# Patient Record
Sex: Female | Born: 2000
Health system: Southern US, Community
[De-identification: ages and names within clinical notes are randomized; demographics above are authoritative.]

## PROBLEM LIST (undated history)

## (undated) DIAGNOSIS — N83209 Unspecified ovarian cyst, unspecified side: Secondary | ICD-10-CM

## (undated) DIAGNOSIS — R519 Headache, unspecified: Secondary | ICD-10-CM

## (undated) DIAGNOSIS — D649 Anemia, unspecified: Secondary | ICD-10-CM

## (undated) DIAGNOSIS — N390 Urinary tract infection, site not specified: Secondary | ICD-10-CM

## (undated) HISTORY — DX: Anemia, unspecified: D64.9

## (undated) HISTORY — PX: NO PAST SURGERIES: SHX2092

---
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
## 2018-03-15 ENCOUNTER — Ambulatory Visit (HOSPITAL_COMMUNITY)
Admission: EM | Admit: 2018-03-15 | Discharge: 2018-03-15 | Disposition: A | Discharge status: Home / Self Care | Payer: Self-pay | Attending: Family Medicine | Admitting: Family Medicine

## 2018-03-15 ENCOUNTER — Encounter (HOSPITAL_COMMUNITY): Payer: Self-pay

## 2018-03-15 DIAGNOSIS — J0100 Acute maxillary sinusitis, unspecified: Secondary | ICD-10-CM

## 2018-03-15 MED ORDER — FLUTICASONE PROPIONATE 50 MCG/ACT NA SUSP: 2.0000 | Freq: Every day | NASAL | 0 refills | Status: DC

## 2018-03-15 MED ORDER — AMOXICILLIN-POT CLAVULANATE 875-125 MG PO TABS: 1.0000 | ORAL_TABLET | Freq: Two times a day (BID) | ORAL | 0 refills | Status: AC

## 2018-03-15 NOTE — ED Provider Notes (Signed)
[facility]   [id number] 03/15/18 Arrival Time: 1847   CC: sinus pain and pressure  SUBJECTIVE: History from: patient.  Rachael Sanchez is a 17 y.o. female who presents with abrupt onset of worsening sinus pressure, nasal congestion, and cough that began 5 days ago.  Denies positive sick exposure or precipitating event.  Has tried ibuprofen and theraflu without relief.  Symptoms are made worse with "working."  Denies previous symptoms in the past.  Complains of chills, and intermittent post-tussive emesis. Also complains of intermittent pink streaks in vomit.  Denies fever, fatigue, SOB, wheezing, chest pain, nausea, blood, bilious, or coffee grounds emesis, changes in bowel or bladder habits.    ROS: As per HPI.  History reviewed. No pertinent past medical history. History reviewed. No pertinent surgical history. No Known Allergies No current facility-administered medications on file prior to encounter.    Current Outpatient Medications on File Prior to Encounter  Medication Sig Dispense Refill  . acetaminophen (TYLENOL) 500 MG tablet Take 500 mg by mouth every 6 (six) hours as needed for mild pain, fever or headache.     Social History   Socioeconomic History  . Marital status: Single    Spouse name: Not on file  . Number of children: Not on file  . Years of education: Not on file  . Highest education level: Not on file  Occupational History  . Not on file  Social Needs  . Financial resource strain: Not on file  . Food insecurity:    Worry: Not on file    Inability: Not on file  . Transportation needs:    Medical: Not on file    Non-medical: Not on file  Tobacco Use  . Smoking status: Current Some Day Smoker  . Smokeless tobacco: Never Used  Substance and Sexual Activity  . Alcohol use: No    Frequency: Never  . Drug use: Not on file  . Sexual activity: Not on file  Lifestyle  . Physical activity:    Days per week: Not on file    Minutes per  session: Not on file  . Stress: Not on file  Relationships  . Social connections:    Talks on phone: Not on file    Gets together: Not on file    Attends religious service: Not on file    Active member of club or organization: Not on file    Attends meetings of clubs or organizations: Not on file    Relationship status: Not on file  . Intimate partner violence:    Fear of current or ex partner: Not on file    Emotionally abused: Not on file    Physically abused: Not on file    Forced sexual activity: Not on file  Other Topics Concern  . Not on file  Social History Narrative  . Not on file   History reviewed. No pertinent family history.  OBJECTIVE:  Vitals:   03/15/18 1942  BP: 119/70  Pulse: 100  Resp: 19  Temp: 98.4 F (36.9 C)  TempSrc: Temporal  SpO2: 100%     General appearance: alert, appears fatigued; nontoxic appearance HEENT: Ears: EACs clear, TMs pearly gray with visible cone of light, without erythema; Eyes: PERRL, EOMI grossly; maxillary sinus tenderness; Nose: clear rhinorrhea; Throat: oropharynx clear, tonsils not enlarged or erythematous, uvula midline Neck: supple without LAD Lungs: CTAB Heart: regular rate and rhythm.  Radial pulses 2+ symmetrical bilaterally Abdomen: Soft nondistended, normal active bowel sounds; nontender to palpation;  no guarding Skin: warm and dry Psychological: alert and cooperative; normal mood and affect  ASSESSMENT & PLAN:  1. Acute non-recurrent maxillary sinusitis     Meds ordered this encounter  Medications  . amoxicillin-clavulanate (AUGMENTIN) 875-125 MG tablet    Sig: Take 1 tablet by mouth every 12 (twelve) hours for 10 days.    Dispense:  20 tablet    Refill:  0    Order Specific Question:   Supervising Provider    Answer:   Isa Rankin [phone]  . fluticasone (FLONASE) 50 MCG/ACT nasal spray    Sig: Place 2 sprays into both nostrils daily.    Dispense:  16 g    Refill:  0    Order Specific  Question:   Supervising Provider    Answer:   Isa Rankin [[id number]]   Get plenty of rest and push fluids Augmentin prescribed.   Take as directed and to completion Flonase prescribed.  Use as directed for symptomatic relief Use OTC medication as needed for symptomatic relief Follow up with PCP or with [facility] if symptoms persist Return or go to ER if you have any new or worsening symptoms    Reviewed expectations re: course of current medical issues. Questions answered. Outlined signs and symptoms indicating need for more acute intervention. Patient verbalized understanding. After Visit Summary given.         Rennis Harding, PA-C 03/15/18 2042

## 2018-03-15 NOTE — Discharge Instructions (Signed)
Get plenty of rest and push fluids Augmentin prescribed.   Take as directed and to completion Flonase prescribed.  Use as directed for symptomatic relief Use OTC medication as needed for symptomatic relief Follow up with PCP or with [facility] if symptoms persist Return or go to ER if you have any new or worsening symptoms

## 2018-03-15 NOTE — ED Triage Notes (Signed)
Pt presents with sinus issues; congestion, vomiting blood, coughing, chills, ear pain and headache.

## 2018-04-15 ENCOUNTER — Ambulatory Visit: Payer: Self-pay

## 2018-04-19 ENCOUNTER — Telehealth: Payer: Self-pay | Admitting: Licensed Clinical Social Worker

## 2018-04-19 NOTE — Telephone Encounter (Signed)
Patient called. No answer. Voicemail left to remind to reschedule appointment.

## 2018-07-04 ENCOUNTER — Encounter: Attending: Family Medicine | Primary: Student in an Organized Health Care Education/Training Program

## 2018-07-04 ENCOUNTER — Ambulatory Visit
Admit: 2018-07-04 | Discharge: 2018-07-04 | Payer: PRIVATE HEALTH INSURANCE | Attending: Family Medicine | Primary: Student in an Organized Health Care Education/Training Program

## 2018-07-04 ENCOUNTER — Ambulatory Visit: Attending: Family Medicine | Primary: Student in an Organized Health Care Education/Training Program

## 2018-07-04 DIAGNOSIS — R519 Headache, unspecified: Secondary | ICD-10-CM

## 2018-07-04 LAB — AMB POC RAPID INFLUENZA TEST
QuickVue Influenza test: NEGATIVE
QuickVue Influenza test: NEGATIVE

## 2018-07-04 LAB — AMB POC RAPID STREP A
Group A Strep Ag: NEGATIVE
Group A Strep Antigen, POC: NEGATIVE

## 2018-07-04 MED ORDER — ONDANSETRON 8 MG TAB, RAPID DISSOLVE
8 mg | ORAL_TABLET | Freq: Once | ORAL | 0 refills | Status: AC
Start: 2018-07-04 — End: 2018-07-04

## 2018-07-04 MED ORDER — KETOROLAC TROMETHAMINE 60 MG/2 ML IM
60 mg/2 mL | Freq: Once | INTRAMUSCULAR | 0 refills | Status: AC
Start: 2018-07-04 — End: 2018-07-04

## 2018-07-04 MED ORDER — PROMETHAZINE 25 MG TAB
25 mg | ORAL_TABLET | Freq: Four times a day (QID) | ORAL | 1 refills | Status: AC | PRN
Start: 2018-07-04 — End: ?

## 2018-07-04 NOTE — Progress Notes (Signed)
Patient: Melanie MinaDakariyah Barnett MRN: [id number]  SSN: [id number]    Date of Birth: 08-06-2000  Age: 17 y.o.  Sex: female      Chief Complaint   Patient presents with   ??? Vomiting   ??? Diarrhea   ??? Cold Symptoms     congestion, headache     Melanie Barnett is a 17 y.o. female new to me who presents with multiple complaints. She cites persistent headache for past 2 days. There was nausea, vomiting and diarrhea at onset of illness 4 days ago. She complains of congestion, dry cough and bilateral sinus pain.  she has not had  swollen glands, myalgias and fever. Symptoms are moderate. Patient is drinking plenty of fluids. There is not a hx of asthma. There is not a hx of allergic rhinitis. There is not a hx of tobacco use. Patient sans health insurance. She has started working in a [employer] this past week and asks for work note.    Medications:     Current Outpatient Medications   Medication Sig   ??? promethazine (PHENERGAN) 25 mg tablet Take 1 Tab by mouth every six (6) hours as needed for Nausea.     No current facility-administered medications for this visit.        Problem List:   There are no active problems to display for this patient.      Medical History:     Past Medical History:   Diagnosis Date   ??? Anemia        Allergies:   Not on File    Surgical History:   History reviewed. No pertinent surgical history.    Social History:     Social History     Socioeconomic History   ??? Marital status: SINGLE     Spouse name: Not on file   ??? Number of children: Not on file   ??? Years of education: Not on file   ??? Highest education level: Not on file   Tobacco Use   ??? Smoking status: Never Smoker   ??? Smokeless tobacco: Never Used   Substance and Sexual Activity   ??? Alcohol use: Yes   ??? Drug use: Yes     Types: Marijuana   ??? Sexual activity: Yes     Partners: Male     Birth control/protection: Injection       Review of Symptoms:  Constitutional: c/o malaise, denies fever or chills  Skin: Negative for rash or  lesion  Head: Negative for facial swelling or tenderness  Eyes: Negative for redness or discharge  Ears: Negative for otalgia or decreased hearing  Nose: c/o nasal congestion, denies sinus pressure  Neck: c/o sore throat, denies lymphadenopathy   Cardiovascular: Negative for chest pain or palpitations  Respiratory: c/o non-productive cough, denies wheezing or SOB  Gastrointestinal: Negative for nausea or abdominal pain  Neurologic: Negative for headache or dizziness      Visit Vitals  BP 118/75 (BP 1 Location: Right arm, BP Patient Position: Sitting)   Pulse 91   Temp 98.5 ??F (36.9 ??C) (Oral)   Resp 12   Ht 5\' 6"  (1.676 m)   Wt 148 lb (67.1 kg)   SpO2 100%   BMI 23.89 kg/m??       Physical Examination:  General: Well developed, well nourished, in no acute distress  Skin: Warm and dry sans rash or lesion  Head: Normocephalic, atraumatic  Eyes: Sclera clear, EOMI  Oropharynx: posterior erythema, no exudate  Neck: Normal range of motion,   Cardiovascular: normal S1, S2, regular rate and rhythm  Respiratory: Clear to auscultation bilaterally with symmetrical, unlabored effort  Abdomen: Soft, Normal BS, non-tender  Extremities: Full range of motion, normal gait  Neurologic: normal strength and sensation, no focal deficits  Psychiatric: Active, alert and oriented      Diagnoses and all orders for this visit:    1. Acute nonintractable headache, unspecified headache type  -     ketorolac tromethamine (TORADOL) 60 mg/2 mL soln; 2 mL by IntraMUSCular route once for 1 dose.  -     KETOROLAC TROMETHAMINE INJ  -     PR THER/PROPH/DIAG INJECTION, SUBCUT/IM    2. Rhinosinusitis  -     AMB POC RAPID INFLUENZA TEST  -     AMB POC RAPID STREP A    3. Gastroenteritis  -     promethazine (PHENERGAN) 25 mg tablet; Take 1 Tab by mouth every six (6) hours as needed for Nausea.  -     ondansetron (ZOFRAN ODT) 8 mg disintegrating tablet; Take 1 Tab by mouth once for 1 dose.        Symptomatic therapy suggested: rest, increase fluids and  call prn if symptoms persist or worsen.  I have discussed the diagnosis with the patient and the intended plan as seen in the above orders.  The patient expresses understanding and agreement with our plan of care.   All of the patient's questions were answered to apparent satisfaction.   The patient has received an after-visit summary.   The patient knows to call our office if there are any questions or concerns regarding diagnosis and treatment plans.   I have discussed medication side effects and warnings with the patient as well.        Follow-up and Dispositions    ?? Return in about 4 days (around 07/08/2018).

## 2018-07-04 NOTE — Progress Notes (Signed)
 1. Have you been to the ER, urgent care clinic since your last visit?  Hospitalized since your last visit? Yes, [place] , headache/sinus infection    2. Have you seen or consulted any other health care providers outside of the [facility] since your last visit?  Include any pap smears or colon screening. No  Reviewed record in preparation for visit and have necessary documentation  Pt did not bring medication to office visit for review    Goals that were addressed and/or need to be completed during or after this appointment include     Health Maintenance Due   Topic Date Due   . DTaP/Tdap/Td series (1 - Tdap) 06/01/2006   . HPV Age 9Y-26Y (1 - Female 2-dose series) 06/01/2010   . Influenza Age 68 to Adult  02/17/2018

## 2018-07-04 NOTE — Progress Notes (Signed)
Patient: Melanie Barnett MRN: [id number]  SSN: [id number]    Date of Birth: 2001/06/18  Age: 17 y.o.  Sex: female      Chief Complaint   Patient presents with   ??? Vomiting   ??? Diarrhea   ??? Cold Symptoms     congestion, headache     Melanie Barnett is a 17 y.o. female new to me who presents with multiple complaints. She cites persistent headache for past 2 days. There was nausea, vomiting and diarrhea at onset of illness 4 days ago. She complains of congestion, dry cough and bilateral sinus pain.  she has not had  swollen glands, myalgias and fever. Symptoms are moderate. Patient is drinking plenty of fluids. There is not a hx of asthma. There is not a hx of allergic rhinitis. There is not a hx of tobacco use. Patient sans health insurance. She has started working in a [employer] this past week and asks for work note.    Medications:     Current Outpatient Medications   Medication Sig   ??? promethazine (PHENERGAN) 25 mg tablet Take 1 Tab by mouth every six (6) hours as needed for Nausea.     No current facility-administered medications for this visit.        Problem List:   There are no active problems to display for this patient.      Medical History:     Past Medical History:   Diagnosis Date   ??? Anemia        Allergies:   Not on File    Surgical History:   History reviewed. No pertinent surgical history.    Social History:     Social History     Socioeconomic History   ??? Marital status: SINGLE     Spouse name: Not on file   ??? Number of children: Not on file   ??? Years of education: Not on file   ??? Highest education level: Not on file   Tobacco Use   ??? Smoking status: Never Smoker   ??? Smokeless tobacco: Never Used   Substance and Sexual Activity   ??? Alcohol use: Yes   ??? Drug use: Yes     Types: Marijuana   ??? Sexual activity: Yes     Partners: Male     Birth control/protection: Injection       Review of Symptoms:  Constitutional: c/o malaise, denies fever or chills  Skin: Negative for rash or lesion   Head: Negative for facial swelling or tenderness  Eyes: Negative for redness or discharge  Ears: Negative for otalgia or decreased hearing  Nose: c/o nasal congestion, denies sinus pressure  Neck: c/o sore throat, denies lymphadenopathy   Cardiovascular: Negative for chest pain or palpitations  Respiratory: c/o non-productive cough, denies wheezing or SOB  Gastrointestinal: Negative for nausea or abdominal pain  Neurologic: Negative for headache or dizziness      Visit Vitals  BP 118/75 (BP 1 Location: Right arm, BP Patient Position: Sitting)   Pulse 91   Temp 98.5 ??F (36.9 ??C) (Oral)   Resp 12   Ht 5\' 6"  (1.676 m)   Wt 148 lb (67.1 kg)   SpO2 100%   BMI 23.89 kg/m??       Physical Examination:  General: Well developed, well nourished, in no acute distress  Skin: Warm and dry sans rash or lesion  Head: Normocephalic, atraumatic  Eyes: Sclera clear, EOMI  Oropharynx: posterior erythema, no exudate  Neck: Normal range of motion,   Cardiovascular: normal S1, S2, regular rate and rhythm  Respiratory: Clear to auscultation bilaterally with symmetrical, unlabored effort  Abdomen: Soft, Normal BS, non-tender  Extremities: Full range of motion, normal gait  Neurologic: normal strength and sensation, no focal deficits  Psychiatric: Active, alert and oriented      Diagnoses and all orders for this visit:    1. Acute nonintractable headache, unspecified headache type  -     ketorolac tromethamine (TORADOL) 60 mg/2 mL soln; 2 mL by IntraMUSCular route once for 1 dose.  -     KETOROLAC TROMETHAMINE INJ  -     PR THER/PROPH/DIAG INJECTION, SUBCUT/IM    2. Rhinosinusitis  -     AMB POC RAPID INFLUENZA TEST  -     AMB POC RAPID STREP A    3. Gastroenteritis  -     promethazine (PHENERGAN) 25 mg tablet; Take 1 Tab by mouth every six (6) hours as needed for Nausea.  -     ondansetron (ZOFRAN ODT) 8 mg disintegrating tablet; Take 1 Tab by mouth once for 1 dose.         Symptomatic therapy suggested: rest, increase fluids and call prn if symptoms persist or worsen.  I have discussed the diagnosis with the patient and the intended plan as seen in the above orders.  The patient expresses understanding and agreement with our plan of care.   All of the patient's questions were answered to apparent satisfaction.   The patient has received an after-visit summary.   The patient knows to call our office if there are any questions or concerns regarding diagnosis and treatment plans.   I have discussed medication side effects and warnings with the patient as well.        Follow-up and Dispositions    ?? Return in about 4 days (around 07/08/2018).

## 2018-07-04 NOTE — Progress Notes (Signed)
1. Have you been to the ER, urgent care clinic since your last visit?  Hospitalized since your last visit? Yes, [place], headache/sinus infection    2. Have you seen or consulted any other health care providers outside of the [facility] since your last visit?  Include any pap smears or colon screening. No  Reviewed record in preparation for visit and have necessary documentation  Pt did not bring medication to office visit for review    Goals that were addressed and/or need to be completed during or after this appointment include     Health Maintenance Due   Topic Date Due   ??? DTaP/Tdap/Td series (1 - Tdap) 06/01/2006   ??? HPV Age 9Y-26Y (1 - Female 2-dose series) 06/01/2010   ??? Influenza Age 9 to Adult  02/17/2018

## 2018-07-04 NOTE — Patient Instructions (Signed)
Headache: Care Instructions  Your Care Instructions    Headaches have many possible causes. Most headaches aren't a sign of a more serious problem, and they will get better on their own. Home treatment may help you feel better faster.  The doctor has checked you carefully, but problems can develop later. If you notice any problems or new symptoms, get medical treatment right away.  Follow-up care is a key part of your treatment and safety. Be sure to make and go to all appointments, and call your doctor if you are having problems. It's also a good idea to know your test results and keep a list of the medicines you take.  How can you care for yourself at home?  ?? Do not drive if you have taken a prescription pain medicine.  ?? Rest in a quiet, dark room until your headache is gone. Close your eyes and try to relax or go to sleep. Don't watch TV or read.  ?? Put a cold, moist cloth or cold pack on the painful area for 10 to 20 minutes at a time. Put a thin cloth between the cold pack and your skin.  ?? Use a warm, moist towel or a heating pad set on low to relax tight shoulder and neck muscles.  ?? Have someone gently massage your neck and shoulders.  ?? Take pain medicines exactly as directed.  ? If the doctor gave you a prescription medicine for pain, take it as prescribed.  ? If you are not taking a prescription pain medicine, ask your doctor if you can take an over-the-counter medicine.  ?? Be careful not to take pain medicine more often than the instructions allow, because you may get worse or more frequent headaches when the medicine wears off.  ?? Do not ignore new symptoms that occur with a headache, such as a fever, weakness or numbness, vision changes, or confusion. These may be signs of a more serious problem.  To prevent headaches  ?? Keep a headache diary so you can figure out what triggers your headaches. Avoiding triggers may help you prevent headaches. Record when  each headache began, how long it lasted, and what the pain was like (throbbing, aching, stabbing, or dull). Write down any other symptoms you had with the headache, such as nausea, flashing lights or dark spots, or sensitivity to bright light or loud noise. Note if the headache occurred near your period. List anything that might have triggered the headache, such as certain foods (chocolate, cheese, wine) or odors, smoke, bright light, stress, or lack of sleep.  ?? Find healthy ways to deal with stress. Headaches are most common during or right after stressful times. Take time to relax before and after you do something that has caused a headache in the past.  ?? Try to keep your muscles relaxed by keeping good posture. Check your jaw, face, neck, and shoulder muscles for tension, and try relaxing them. When sitting at a desk, change positions often, and stretch for 30 seconds each hour.  ?? Get plenty of sleep and exercise.  ?? Eat regularly and well. Long periods without food can trigger a headache.  ?? Treat yourself to a massage. Some people find that regular massages are very helpful in relieving tension.  ?? Limit caffeine by not drinking too much coffee, tea, or soda. But don't quit caffeine suddenly, because that can also give you headaches.  ?? Reduce eyestrain from computers by blinking frequently and looking away from the   computer screen every so often. Make sure you have proper eyewear and that your monitor is set up properly, about an arm's length away.  ?? Seek help if you have depression or anxiety. Your headaches may be linked to these conditions. Treatment can both prevent headaches and help with symptoms of anxiety or depression.  When should you call for help?  Call 911 anytime you think you may need emergency care. For example, call if:  ?? ?? You have signs of a stroke. These may include:  ? Sudden numbness, paralysis, or weakness in your face, arm, or leg, especially on only one side of your body.   ? Sudden vision changes.  ? Sudden trouble speaking.  ? Sudden confusion or trouble understanding simple statements.  ? Sudden problems with walking or balance.  ? A sudden, severe headache that is different from past headaches.   ??Call your doctor now or seek immediate medical care if:  ?? ?? You have a new or worse headache.   ?? ?? Your headache gets much worse.   Where can you learn more?  Go to http://www.healthwise.net/[employer].  Enter M271 in the search box to learn more about "Headache: Care Instructions."  Current as of: October 14, 2017  Content Version: 12.2  ?? 2006-2019 [employer]. Care instructions adapted under license by [employer] (which disclaims liability or warranty for this information). If you have questions about a medical condition or this instruction, always ask your healthcare professional. Healthwise[employer] Inc[employer] disclaims any warranty or liability for your use of this information.

## 2018-07-08 ENCOUNTER — Ambulatory Visit
Admit: 2018-07-08 | Discharge: 2018-07-08 | Payer: PRIVATE HEALTH INSURANCE | Attending: Student in an Organized Health Care Education/Training Program | Primary: Student in an Organized Health Care Education/Training Program

## 2018-07-08 ENCOUNTER — Ambulatory Visit
Attending: Student in an Organized Health Care Education/Training Program | Primary: Student in an Organized Health Care Education/Training Program

## 2018-07-08 DIAGNOSIS — N946 Dysmenorrhea, unspecified: Secondary | ICD-10-CM

## 2018-07-08 MED ORDER — MEDROXYPROGESTERONE (DEPO-PROVERA) 150 MG/ML IM SYRINGE
150 mg/mL | INJECTION | Freq: Once | INTRAMUSCULAR | 3 refills | Status: AC
Start: 2018-07-08 — End: 2018-07-08

## 2018-07-08 NOTE — Progress Notes (Signed)
I reviewed with the resident the medical history and the resident's findings on the physical examination.  I discussed with the resident the patient's diagnosis and concur with the plan.

## 2018-07-08 NOTE — Progress Notes (Signed)
[facility]    Subjective:   Melanie Barnett is a 17 y.o. female with hx of IDA  CC: dysmenorrhea  History provided by patient    HPI:  She was previously getting [place]     She got her last depo shot on April 13, 2018. She switched from the implant to this in September. She has not been experiencing side effects. She states that she was spotting throughout the first couple of months. It has now stopped. She does not experience palpitations, dizziness, fatigue. She has a lot of cramping during menstruation.    PMHx: iron deficiency anemia    SHx: Smokes marijuana daily. No cigarettes. Occasional alcohol use.     Surg: None    FHx: Diabetes in father. No blood clots or heart disease.    PFSH:     Current Outpatient Medications on File Prior to Visit   Medication Sig Dispense Refill   ??? promethazine (PHENERGAN) 25 mg tablet Take 1 Tab by mouth every six (6) hours as needed for Nausea. 30 Tab 1     No current facility-administered medications on file prior to visit.        There is no problem list on file for this patient.      Social History     Socioeconomic History   ??? Marital status: SINGLE     Spouse name: Not on file   ??? Number of children: Not on file   ??? Years of education: Not on file   ??? Highest education level: Not on file   Occupational History   ??? Not on file   Social Needs   ??? Financial resource strain: Not on file   ??? Food insecurity:     Worry: Not on file     Inability: Not on file   ??? Transportation needs:     Medical: Not on file     Non-medical: Not on file   Tobacco Use   ??? Smoking status: Never Smoker   ??? Smokeless tobacco: Never Used   Substance and Sexual Activity   ??? Alcohol use: Yes   ??? Drug use: Yes     Types: Marijuana   ??? Sexual activity: Yes     Partners: Male     Birth control/protection: Injection   Lifestyle   ??? Physical activity:     Days per week: Not on file     Minutes per  session: Not on file   ??? Stress: Not on file   Relationships   ??? Social connections:     Talks on phone: Not on file     Gets together: Not on file     Attends religious service: Not on file     Active member of club or organization: Not on file     Attends meetings of clubs or organizations: Not on file     Relationship status: Not on file   ??? Intimate partner violence:     Fear of current or ex partner: Not on file     Emotionally abused: Not on file     Physically abused: Not on file     Forced sexual activity: Not on file   Other Topics Concern   ??? Not on file   Social History Narrative   ??? Not on file       Review of Systems   Constitutional: Negative for chills,  fever and malaise/fatigue.   Cardiovascular: Negative for chest pain and palpitations.   Gastrointestinal: Negative for abdominal pain, constipation, diarrhea, nausea and vomiting.   Genitourinary: Negative for dysuria and hematuria.        Positive for intense menstrual cramping. Negative for excessive bleeding during menstruation.   Neurological: Negative for dizziness and headaches.         Objective:     Visit Vitals  BP 117/84 (BP 1 Location: Left arm, BP Patient Position: Sitting)   Pulse 96   Temp 97.1 ??F (36.2 ??C) (Oral)   Resp 16   Ht 5\' 6"  (1.676 m)   Wt 150 lb (68 kg)   SpO2 100%   BMI 24.21 kg/m??          Physical Exam  Constitutional:       General: She is not in acute distress.     Appearance: Normal appearance. She is not ill-appearing.   HENT:      Head: Normocephalic and atraumatic.   Cardiovascular:      Rate and Rhythm: Normal rate and regular rhythm.      Pulses: Normal pulses.      Heart sounds: No murmur.   Pulmonary:      Effort: Pulmonary effort is normal. No respiratory distress.      Breath sounds: No wheezing, rhonchi or rales.   Musculoskeletal: Normal range of motion.      Right lower leg: No edema.      Left lower leg: No edema.   Skin:     Coloration: Skin is not pale.   Neurological:      Mental Status: She is alert.          Pertinent Labs/Studies: none      Assessment and orders:       ICD-10-CM ICD-9-CM    1. Dysmenorrhea N94.6 625.3      Encounter Diagnoses   Name Primary?   ??? Dysmenorrhea Yes     Diagnoses and all orders for this visit:    1. Dysmenorrhea - pt has excessive menstrual cramping during periods. Pt is looking to transition from implant to depo shots. She is currently self pay. Discussed trying health department first, but will prescribe in meantime if not a reasonable option.  -     medroxyPROGESTERone (DEPO-PROVERA) 150 mg/mL syrg; 1 mL by IntraMUSCular route once for 1 dose.      Follow-up and Dispositions    ?? Return if symptoms worsen or fail to improve.           I have discussed the diagnosis with the patient and the intended plan as seen in the above orders.  Social history, medical history, and labs were reviewed.  The patient has received an after-visit summary and questions were answered concerning future plans.  I have discussed medication side effects and warnings with the patient as well.    Judithann GravesAllison A Taliya Mcclard, MD  Resident [facility]  07/11/18

## 2018-07-08 NOTE — Progress Notes (Signed)
1. Have you been to the ER, urgent care clinic since your last visit?  Hospitalized since your last visit? no    2. Have you seen or consulted any other health care providers outside of the [facility] since your last visit?  Include any pap smears or colon screening. no  Reviewed record in preparation for visit and have obtained necessary documentation.  Patient did not bring medications to visit for review.  Information provided on Advanced Directive, Living Will.  Body mass index is 24.21 kg/m.   Health Maintenance Due   Topic Date Due   . DTaP/Tdap/Td series (1 - Tdap) 06/01/2006   . HPV Age 9Y-26Y (1 - Female 2-dose series) 06/01/2010   . Influenza Age 86 to Adult  02/17/2018

## 2018-07-08 NOTE — Progress Notes (Signed)
1. Have you been to the ER, urgent care clinic since your last visit?  Hospitalized since your last visit? no    2. Have you seen or consulted any other health care providers outside of the [facility] since your last visit?  Include any pap smears or colon screening. no  Reviewed record in preparation for visit and have obtained necessary documentation.  Patient did not bring medications to visit for review.  Information provided on Advanced Directive, Living Will.  Body mass index is 24.21 kg/m??.   Health Maintenance Due   Topic Date Due   ??? DTaP/Tdap/Td series (1 - Tdap) 06/01/2006   ??? HPV Age 9Y-26Y (1 - Female 2-dose series) 06/01/2010   ??? Influenza Age 9 to Adult  02/17/2018

## 2018-07-08 NOTE — Progress Notes (Signed)
[facility]    Subjective:   Melanie Barnett is a 17 y.o. female with hx of IDA  CC: dysmenorrhea  History provided by patient    HPI:  She was previously getting [place]     She got her last depo shot on April 13, 2018. She switched from the implant to this in September. She has not been experiencing side effects. She states that she was spotting throughout the first couple of months. It has now stopped. She does not experience palpitations, dizziness, fatigue. She has a lot of cramping during menstruation.    PMHx: iron deficiency anemia    SHx: Smokes marijuana daily. No cigarettes. Occasional alcohol use.     Surg: None    FHx: Diabetes in father. No blood clots or heart disease.    PFSH:     Current Outpatient Medications on File Prior to Visit   Medication Sig Dispense Refill   ??? promethazine (PHENERGAN) 25 mg tablet Take 1 Tab by mouth every six (6) hours as needed for Nausea. 30 Tab 1     No current facility-administered medications on file prior to visit.        There is no problem list on file for this patient.      Social History     Socioeconomic History   ??? Marital status: SINGLE     Spouse name: Not on file   ??? Number of children: Not on file   ??? Years of education: Not on file   ??? Highest education level: Not on file   Occupational History   ??? Not on file   Social Needs   ??? Financial resource strain: Not on file   ??? Food insecurity:     Worry: Not on file     Inability: Not on file   ??? Transportation needs:     Medical: Not on file     Non-medical: Not on file   Tobacco Use   ??? Smoking status: Never Smoker   ??? Smokeless tobacco: Never Used   Substance and Sexual Activity   ??? Alcohol use: Yes   ??? Drug use: Yes     Types: Marijuana   ??? Sexual activity: Yes     Partners: Male     Birth control/protection: Injection   Lifestyle   ??? Physical activity:     Days per week: Not on file      Minutes per session: Not on file   ??? Stress: Not on file   Relationships   ??? Social connections:     Talks on phone: Not on file     Gets together: Not on file     Attends religious service: Not on file     Active member of club or organization: Not on file     Attends meetings of clubs or organizations: Not on file     Relationship status: Not on file   ??? Intimate partner violence:     Fear of current or ex partner: Not on file     Emotionally abused: Not on file     Physically abused: Not on file     Forced sexual activity: Not on file   Other Topics Concern   ??? Not on file   Social History Narrative   ??? Not on file       Review of Systems   Constitutional: Negative for chills,  fever and malaise/fatigue.   Cardiovascular: Negative for chest pain and palpitations.   Gastrointestinal: Negative for abdominal pain, constipation, diarrhea, nausea and vomiting.   Genitourinary: Negative for dysuria and hematuria.        Positive for intense menstrual cramping. Negative for excessive bleeding during menstruation.   Neurological: Negative for dizziness and headaches.         Objective:     Visit Vitals  BP 117/84 (BP 1 Location: Left arm, BP Patient Position: Sitting)   Pulse 96   Temp 97.1 ??F (36.2 ??C) (Oral)   Resp 16   Ht 5\' 6"  (1.676 m)   Wt 150 lb (68 kg)   SpO2 100%   BMI 24.21 kg/m??          Physical Exam  Constitutional:       General: She is not in acute distress.     Appearance: Normal appearance. She is not ill-appearing.   HENT:      Head: Normocephalic and atraumatic.   Cardiovascular:      Rate and Rhythm: Normal rate and regular rhythm.      Pulses: Normal pulses.      Heart sounds: No murmur.   Pulmonary:      Effort: Pulmonary effort is normal. No respiratory distress.      Breath sounds: No wheezing, rhonchi or rales.   Musculoskeletal: Normal range of motion.      Right lower leg: No edema.      Left lower leg: No edema.   Skin:     Coloration: Skin is not pale.   Neurological:       Mental Status: She is alert.         Pertinent Labs/Studies: none      Assessment and orders:       ICD-10-CM ICD-9-CM    1. Dysmenorrhea N94.6 625.3      Encounter Diagnoses   Name Primary?   ??? Dysmenorrhea Yes     Diagnoses and all orders for this visit:    1. Dysmenorrhea - pt has excessive menstrual cramping during periods. Pt is looking to transition from implant to depo shots. She is currently self pay. Discussed trying health department first, but will prescribe in meantime if not a reasonable option.  -     medroxyPROGESTERone (DEPO-PROVERA) 150 mg/mL syrg; 1 mL by IntraMUSCular route once for 1 dose.      Follow-up and Dispositions    ?? Return if symptoms worsen or fail to improve.           I have discussed the diagnosis with the patient and the intended plan as seen in the above orders.  Social history, medical history, and labs were reviewed.  The patient has received an after-visit summary and questions were answered concerning future plans.  I have discussed medication side effects and warnings with the patient as well.    Judithann GravesAllison A Janavia Rottman, MD  Resident [facility]  07/11/18

## 2018-07-08 NOTE — Patient Instructions (Signed)
Medroxyprogesterone (By injection)   Medroxyprogesterone (me-drox-ee-proe-JES-ter-one)  Prevents pregnancy. Also treats endometriosis and is used with other medicines to help relieve symptoms of cancer, including uterine or kidney cancer.   Brand Name(s): Depo-Provera, Depo-Provera Contraceptive, Depo-SubQ Provera 104   There may be other brand names for this medicine.  When This Medicine Should Not Be Used:   This medicine is not right for everyone. You should not receive it if you had an allergic reaction to medroxyprogesterone or if you have a history of breast cancer or blood clots (including heart attack or stroke). In most cases, you should not use this medicine while you are pregnant.   How to Use This Medicine:   Injectable  ?? A nurse or other health provider will give you this medicine. This medicine is given as a shot into a muscle or just under the skin.  ?? Your exact treatment schedule depends on the reason you are using this medicine. You doctor will explain your personal schedule.   ?? For treatment of cancer symptoms, you may start with a shot once per week. You may need fewer shots as your treatment goes forward.  ?? For birth control or endometriosis, you will need a shot every 3 months (13 weeks). Read and follow the patient instructions that come with this medicine. Talk to your doctor or pharmacist if you have any questions.  ?? You might need to have the first shot during the first 5 days of your normal menstrual period, to make sure you are not pregnant. If you have just had a baby, you may receive a shot 5 days after birth if you are not breastfeeding or 6 weeks after birth if you are breastfeeding.  ?? Missed dose: You must receive a shot every 3 months if you want to prevent pregnancy. Talk to your doctor or pharmacist if you do not receive your medicine on time, because you may need another form of birth control.  Drugs and Foods to Avoid:    Ask your doctor or pharmacist before using any other medicine, including over-the-counter medicines, vitamins, and herbal products.  ?? Some foods and medicines can affect how medroxyprogesterone works. Tell your doctor if you are using any of the following:  ?? Aminoglutethimide, bosentan, carbamazepine, felbamate, griseofulvin, nefazodone, oxcarbazepine, phenobarbital, phenytoin, rifabutin, rifampin, rifapentine, St John's wort, topiramate  ?? Medicine to treat an infection (including clarithromycin, itraconazole, ketoconazole, telithromycin, voriconazole)  ?? Medicine to treat HIV/AIDS (including atazanavir, indinavir, nelfinavir, ritonavir, saquinavir)  Warnings While Using This Medicine:   ?? Tell your doctor right away if you think you have become pregnant.  ?? Tell your doctor if you are breastfeeding or if you have liver disease, kidney disease, asthma, diabetes, heart disease, seizures, migraine headaches, an eating disorder, osteoporosis, or a history of depression. Tell your doctor if you smoke.  ?? This medicine may cause the following problems:  ?? Blood clots, which could lead to stroke, heart attack, or other serious problems  ?? Possible increased risk of breast cancer  ?? Weak or thin bones, especially with long-term use  ?? You should not use this medicine for long-term birth control unless you cannot use any other form of birth control.  ?? This medicine will not protect you from HIV/AIDS or other sexually transmitted diseases.  ?? Tell any doctor or dentist who treats you that you are using this medicine. This medicine may affect certain medical test results.  ?? Your doctor will check your progress and the   effects of this medicine at regular visits. Keep all appointments.  Possible Side Effects While Using This Medicine:   Call your doctor right away if you notice any of these side effects:  ?? Allergic reaction: Itching or hives, swelling in your face or hands,  swelling or tingling in your mouth or throat, chest tightness, trouble breathing  ?? Chest pain, trouble breathing, or coughing up blood  ?? Dark urine or pale stools, nausea, vomiting, loss of appetite, stomach pain, yellow skin or eyes  ?? Heavy or nonstop vaginal bleeding  ?? Loss of vision, double vision  ?? Numbness or weakness on one side of your body, sudden or severe headache, problems with vision, speech, or walking  ?? Severe stomach pain or cramps  If you notice these less serious side effects, talk with your doctor:   ?? Headache  ?? Light or missed monthly periods, spotting between periods  ?? Nervousness or dizziness  ?? Pain, redness, burning, swelling, or a lump under your skin where the shot was given  ?? Weight gain  If you notice other side effects that you think are caused by this medicine, tell your doctor.   Call your doctor for medical advice about side effects. You may report side effects to FDA at 1-800-FDA-1088  ?? 2017 [employer] Information is for End User's use only and may not be sold, redistributed or otherwise used for commercial purposes.  The above information is an educational aid only. It is not intended as medical advice for individual conditions or treatments. Talk to your doctor, nurse or pharmacist before following any medical regimen to see if it is safe and effective for you.

## 2020-07-08 ENCOUNTER — Ambulatory Visit
Admission: EM | Admit: 2020-07-08 | Discharge: 2020-07-08 | Disposition: A | Discharge status: Home / Self Care | Payer: Self-pay | Attending: Emergency Medicine | Admitting: Emergency Medicine

## 2020-07-08 ENCOUNTER — Ambulatory Visit (HOSPITAL_COMMUNITY): Admit: 2020-07-08 | Discharge status: Against Medical Advice | Payer: Self-pay

## 2020-07-08 DIAGNOSIS — J069 Acute upper respiratory infection, unspecified: Secondary | ICD-10-CM

## 2020-07-08 MED ORDER — BENZONATATE 200 MG PO CAPS: 200.0000 mg | ORAL_CAPSULE | Freq: Three times a day (TID) | ORAL | 0 refills | Status: AC | PRN

## 2020-07-08 MED ORDER — DM-GUAIFENESIN ER 30-600 MG PO TB12: 1.0000 | ORAL_TABLET | Freq: Two times a day (BID) | ORAL | 0 refills | Status: DC

## 2020-07-08 MED ORDER — IBUPROFEN 800 MG PO TABS: 800.0000 mg | ORAL_TABLET | Freq: Three times a day (TID) | ORAL | 0 refills | Status: DC

## 2020-07-08 NOTE — Discharge Instructions (Signed)
Use anti-inflammatories for pain/fever. You may take up to 800 mg Ibuprofen every 8 hours with food. You may supplement Ibuprofen with Tylenol [phone] mg every 8 hours.  Tessalon for cough Support with Mucinex DM twice daily for congestion and cough Rest and fluids Follow-up if not improving or worsening

## 2020-07-08 NOTE — ED Provider Notes (Signed)
[facility]    CSN: [id number] Arrival date & time: 07/08/20  1641      History   Chief Complaint Chief Complaint  Patient presents with  . Cough    HPI Rachael Sanchez is a 19 y.o. female presenting today for evaluation of cough.  Patient reports that she began to develop sore throat a few days ago which is now resolved.  Reports associated cough congestion chills and some chest discomfort.  She denies any nausea vomiting or diarrhea.  Using over-the-counter medicine without relief.  HPI  History reviewed. No pertinent past medical history.  There are no problems to display for this patient.   History reviewed. No pertinent surgical history.  OB History   No obstetric history on file.      Home Medications    Prior to Admission medications   Medication Sig Start Date End Date Taking? Authorizing Provider  fluticasone (FLONASE) 50 MCG/ACT nasal spray Place 2 sprays into both nostrils daily. 03/15/18  Yes Wurst, [place], PA-C  acetaminophen (TYLENOL) 500 MG tablet Take 500 mg by mouth every 6 (six) hours as needed for mild pain, fever or headache.    [provider]  benzonatate (TESSALON) 200 MG capsule Take 1 capsule (200 mg total) by mouth 3 (three) times daily as needed for up to 7 days for cough. 07/08/20 07/15/20  Chauncey Bruno C, PA-C  dextromethorphan-guaiFENesin (MUCINEX DM) 30-600 MG 12hr tablet Take 1 tablet by mouth 2 (two) times daily. 07/08/20   Kaleeah Gingerich C, PA-C  ibuprofen (ADVIL) 800 MG tablet Take 1 tablet (800 mg total) by mouth 3 (three) times daily. 07/08/20   Mayrene Bastarache, Junius Creamer, PA-C    Family History Family History  Problem Relation Age of Onset  . Healthy Mother   . Diabetes Father     Social History Social History   Tobacco Use  . Smoking status: Never Smoker  . Smokeless tobacco: Never Used  Vaping Use  . Vaping Use: Never used  Substance Use Topics  . Alcohol use: Yes  . Drug use: Yes    Types:  Marijuana     Allergies   Patient has no known allergies.   Review of Systems Review of Systems  Constitutional: Negative for activity change, appetite change, chills, fatigue and fever.  HENT: Positive for congestion, rhinorrhea, sinus pressure and sore throat. Negative for ear pain and trouble swallowing.   Eyes: Negative for discharge and redness.  Respiratory: Positive for cough. Negative for chest tightness and shortness of breath.   Cardiovascular: Negative for chest pain.  Gastrointestinal: Negative for abdominal pain, diarrhea, nausea and vomiting.  Musculoskeletal: Negative for myalgias.  Skin: Negative for rash.  Neurological: Negative for dizziness, light-headedness and headaches.     Physical Exam Triage Vital Signs ED Triage Vitals  Enc Vitals Group     BP 07/08/20 1748 137/77     Pulse Rate 07/08/20 1748 (!) 107     Resp 07/08/20 1748 18     Temp 07/08/20 1748 98.9 F (37.2 C)     Temp Source 07/08/20 1748 Oral     SpO2 07/08/20 1748 100 %     Weight --      Height --      Head Circumference --      Peak Flow --      Pain Score 07/08/20 1745 7     Pain Loc --      Pain Edu? --      Excl.  in GC? --    No data found.  Updated Vital Signs BP 137/77 (BP Location: Left Arm)   Pulse (!) 107   Temp 98.9 F (37.2 C) (Oral)   Resp 18   LMP 07/06/2020 (Approximate)   SpO2 100%   Visual Acuity Right Eye Distance:   Left Eye Distance:   Bilateral Distance:    Right Eye Near:   Left Eye Near:    Bilateral Near:     Physical Exam Vitals and nursing note reviewed.  Constitutional:      Appearance: She is well-developed and well-nourished.     Comments: No acute distress  HENT:     Head: Normocephalic and atraumatic.     Ears:     Comments: Bilateral ears without tenderness to palpation of external auricle, tragus and mastoid, EAC's without erythema or swelling, TM's with good bony landmarks and cone of light. Non erythematous.     Nose: Nose  normal.     Mouth/Throat:     Comments: Oral mucosa pink and moist, no tonsillar enlargement or exudate. Posterior pharynx patent and nonerythematous, no uvula deviation or swelling. Normal phonation. Eyes:     Conjunctiva/sclera: Conjunctivae normal.  Cardiovascular:     Rate and Rhythm: Normal rate.  Pulmonary:     Effort: Pulmonary effort is normal. No respiratory distress.     Comments: Breathing comfortably at rest, CTABL, no wheezing, rales or other adventitious sounds auscultated Abdominal:     General: There is no distension.  Musculoskeletal:        General: Normal range of motion.     Cervical back: Neck supple.  Skin:    General: Skin is warm and dry.  Neurological:     Mental Status: She is alert and oriented to person, place, and time.  Psychiatric:        Mood and Affect: Mood and affect normal.      UC Treatments / Results  Labs (all labs ordered are listed, but only abnormal results are displayed) Labs Reviewed  NOVEL CORONAVIRUS, NAA    EKG   Radiology No results found.  Procedures Procedures (including critical care time)  Medications Ordered in UC Medications - No data to display  Initial Impression / Assessment and Plan / UC Course  I have reviewed the triage vital signs and the nursing notes.  Pertinent labs & imaging results that were available during my care of the patient were reviewed by me and considered in my medical decision making (see chart for details).     Viral URI with cough-Covid test pending, recommending symptomatic and supportive care rest and fluids.  Exam reassuring, lungs clear to auscultation at this time.  Discussed strict return precautions. Patient verbalized understanding and is agreeable with plan.  Final Clinical Impressions(s) / UC Diagnoses   Final diagnoses:  Viral URI with cough     Discharge Instructions     Use anti-inflammatories for pain/fever. You may take up to 800 mg Ibuprofen every 8 hours with  food. You may supplement Ibuprofen with Tylenol [phone] mg every 8 hours.  Tessalon for cough Support with Mucinex DM twice daily for congestion and cough Rest and fluids Follow-up if not improving or worsening    ED Prescriptions    Medication Sig Dispense Auth. Provider   benzonatate (TESSALON) 200 MG capsule Take 1 capsule (200 mg total) by mouth 3 (three) times daily as needed for up to 7 days for cough. 28 capsule Adelin Ventrella, Bertram C, PA-C  dextromethorphan-guaiFENesin (MUCINEX DM) 30-600 MG 12hr tablet Take 1 tablet by mouth 2 (two) times daily. 20 tablet Louellen Haldeman C, PA-C   ibuprofen (ADVIL) 800 MG tablet Take 1 tablet (800 mg total) by mouth 3 (three) times daily. 21 tablet Geisha Abernathy, [place] C, PA-C     PDMP not reviewed this encounter.   Lew Dawes, [place] 07/08/20 [phone]

## 2020-07-08 NOTE — ED Triage Notes (Signed)
Pt c/o HA onset Thursday. sore throat onset Friday-now resolved. C/o non-productive cough, congestion, chills, subjective fever, discomfort to chest area when coughing.   Denies n/v/d, loss of taste/smell  Has been taking OTC cough medications w/o improvement of symptoms.  Denies h/o COVID infection or covid vaccines.

## 2020-07-10 LAB — SARS-COV-2, NAA 2 DAY TAT

## 2020-07-10 LAB — NOVEL CORONAVIRUS, NAA: SARS-CoV-2, NAA: NOT DETECTED

## 2020-07-18 ENCOUNTER — Ambulatory Visit: Payer: Self-pay

## 2020-08-02 ENCOUNTER — Ambulatory Visit (HOSPITAL_COMMUNITY)
Admission: EM | Admit: 2020-08-02 | Discharge: 2020-08-02 | Disposition: A | Discharge status: Home / Self Care | Payer: HRSA Program | Attending: Physician Assistant | Admitting: Physician Assistant

## 2020-08-02 ENCOUNTER — Encounter (HOSPITAL_COMMUNITY): Payer: Self-pay | Admitting: *Deleted

## 2020-08-02 ENCOUNTER — Other Ambulatory Visit: Payer: Self-pay

## 2020-08-02 DIAGNOSIS — U071 COVID-19: Secondary | ICD-10-CM | POA: Diagnosis not present

## 2020-08-02 DIAGNOSIS — J029 Acute pharyngitis, unspecified: Secondary | ICD-10-CM | POA: Insufficient documentation

## 2020-08-02 DIAGNOSIS — K0889 Other specified disorders of teeth and supporting structures: Secondary | ICD-10-CM | POA: Insufficient documentation

## 2020-08-02 LAB — POCT RAPID STREP A, ED / UC: Streptococcus, Group A Screen (Direct): NEGATIVE

## 2020-08-02 MED ORDER — AMOXICILLIN-POT CLAVULANATE 875-125 MG PO TABS: 1.0000 | ORAL_TABLET | Freq: Two times a day (BID) | ORAL | 0 refills | Status: DC

## 2020-08-02 NOTE — ED Triage Notes (Signed)
Pt reports sore throat and reports swelling to Lt side of throat for 3 days. Pt is speaking in full sentences and no acute resp distress noted.

## 2020-08-02 NOTE — Discharge Instructions (Addendum)
COVID test pending.  Take ibuprofen/Tylenol as needed for pain Follow up with dentist as long as COVID test is negative.

## 2020-08-02 NOTE — ED Provider Notes (Signed)
[facility]    CSN: [id number] Arrival date & time: 08/02/20  1701      History   Chief Complaint Chief Complaint  Patient presents with  . Oral Swelling    Lt side  . Sore Throat    HPI Rachael Sanchez is a 20 y.o. female.   Pt complains of sore throat that started about three days ago.  Denies congestion, cough, shortness of breath, body aches, fever, chills.  She has tried chloraseptic lozenges with temporary relief.  She also complains of dental pain, left wisdom tooth.  She has an appointment tomorrow with a dentist for this problem.       History reviewed. No pertinent past medical history.  There are no problems to display for this patient.   History reviewed. No pertinent surgical history.  OB History   No obstetric history on file.      Home Medications    Prior to Admission medications   Medication Sig Start Date End Date Taking? Authorizing Provider  acetaminophen (TYLENOL) 500 MG tablet Take 500 mg by mouth every 6 (six) hours as needed for mild pain, fever or headache.   Yes [provider]  ibuprofen (ADVIL) 800 MG tablet Take 1 tablet (800 mg total) by mouth 3 (three) times daily. 07/08/20  Yes Wieters, Hallie C, PA-C  dextromethorphan-guaiFENesin (MUCINEX DM) 30-600 MG 12hr tablet Take 1 tablet by mouth 2 (two) times daily. 07/08/20   Wieters, Hallie C, PA-C  fluticasone (FLONASE) 50 MCG/ACT nasal spray Place 2 sprays into both nostrils daily. 03/15/18   Rennis Harding, PA-C    Family History Family History  Problem Relation Age of Onset  . Healthy Mother   . Diabetes Father     Social History Social History   Tobacco Use  . Smoking status: Never Smoker  . Smokeless tobacco: Never Used  Vaping Use  . Vaping Use: Never used  Substance Use Topics  . Alcohol use: Yes  . Drug use: Yes    Types: Marijuana     Allergies   Patient has no known allergies.   Review of Systems Review of Systems  Constitutional:  Negative for chills and fever.  HENT: Positive for dental problem and sore throat. Negative for ear pain.   Eyes: Negative for pain and visual disturbance.  Respiratory: Negative for cough and shortness of breath.   Cardiovascular: Negative for chest pain and palpitations.  Gastrointestinal: Negative for abdominal pain and vomiting.  Genitourinary: Negative for dysuria and hematuria.  Musculoskeletal: Negative for arthralgias and back pain.  Skin: Negative for color change and rash.  Neurological: Negative for seizures and syncope.  All other systems reviewed and are negative.    Physical Exam Triage Vital Signs ED Triage Vitals  Enc Vitals Group     BP 08/02/20 1740 126/74     Pulse Rate 08/02/20 1740 84     Resp 08/02/20 1740 18     Temp 08/02/20 1740 99.4 F (37.4 C)     Temp Source 08/02/20 1740 Oral     SpO2 08/02/20 1740 100 %     Weight --      Height --      Head Circumference --      Peak Flow --      Pain Score 08/02/20 1741 8     Pain Loc --      Pain Edu? --      Excl. in GC? --    No data found.  Updated Vital Signs BP 126/74 (BP Location: Right Arm)   Pulse 84   Temp 99.4 F (37.4 C) (Oral)   Resp 18   LMP 07/06/2020 (Approximate)   SpO2 100%   Visual Acuity Right Eye Distance:   Left Eye Distance:   Bilateral Distance:    Right Eye Near:   Left Eye Near:    Bilateral Near:     Physical Exam Vitals and nursing note reviewed.  Constitutional:      General: She is not in acute distress.    Appearance: She is well-developed and well-nourished.  HENT:     Head: Normocephalic and atraumatic.     Mouth/Throat:     Dentition: Dental tenderness present.     Pharynx: Pharyngeal swelling and posterior oropharyngeal erythema present.     Tonsils: No tonsillar exudate or tonsillar abscesses.   Eyes:     Conjunctiva/sclera: Conjunctivae normal.  Cardiovascular:     Rate and Rhythm: Normal rate and regular rhythm.     Heart sounds: No murmur  heard.   Pulmonary:     Effort: Pulmonary effort is normal. No respiratory distress.     Breath sounds: Normal breath sounds.  Abdominal:     Palpations: Abdomen is soft.     Tenderness: There is no abdominal tenderness.  Musculoskeletal:        General: No edema.     Cervical back: Neck supple.  Skin:    General: Skin is warm and dry.  Neurological:     Mental Status: She is alert.  Psychiatric:        Mood and Affect: Mood and affect normal.      UC Treatments / Results  Labs (all labs ordered are listed, but only abnormal results are displayed) Labs Reviewed  SARS CORONAVIRUS 2 (TAT 6-24 HRS)  POCT RAPID STREP A, ED / UC    EKG   Radiology No results found.  Procedures Procedures (including critical care time)  Medications Ordered in UC Medications - No data to display  Initial Impression / Assessment and Plan / UC Course  I have reviewed the triage vital signs and the nursing notes.  Pertinent labs & imaging results that were available during my care of the patient were reviewed by me and considered in my medical decision making (see chart for details).     Tenderness and swelling to left lower molar.  She has a follow up with dentist tomorrow.  Will started antibiotic for possible dental abscess.   Sore throat-bilateral tonsillar swelling and redness. Negative strep. COVID test pending.  Final Clinical Impressions(s) / UC Diagnoses   Final diagnoses:  None   Discharge Instructions   None    ED Prescriptions    None     PDMP not reviewed this encounter.   Jodell Cipro, PA-C 08/02/20 1832

## 2020-08-03 LAB — SARS CORONAVIRUS 2 (TAT 6-24 HRS): SARS Coronavirus 2: POSITIVE — AB

## 2020-08-05 LAB — CULTURE, GROUP A STREP (THRC)

## 2020-12-21 ENCOUNTER — Ambulatory Visit (HOSPITAL_COMMUNITY)
Admission: EM | Admit: 2020-12-21 | Discharge: 2020-12-21 | Disposition: A | Discharge status: Home / Self Care | Payer: Self-pay | Attending: Internal Medicine | Admitting: Internal Medicine

## 2020-12-21 ENCOUNTER — Encounter (HOSPITAL_COMMUNITY): Payer: Self-pay

## 2020-12-21 DIAGNOSIS — N938 Other specified abnormal uterine and vaginal bleeding: Secondary | ICD-10-CM

## 2020-12-21 DIAGNOSIS — R1030 Lower abdominal pain, unspecified: Secondary | ICD-10-CM | POA: Insufficient documentation

## 2020-12-21 LAB — COMPREHENSIVE METABOLIC PANEL
ALT: 11 U/L (ref 0–44)
AST: 19 U/L (ref 15–41)
Albumin: 4.5 g/dL (ref 3.5–5.0)
Alkaline Phosphatase: 66 U/L (ref 38–126)
Anion gap: 8 (ref 5–15)
BUN: 11 mg/dL (ref 6–20)
CO2: 23 mmol/L (ref 22–32)
Calcium: 9.8 mg/dL (ref 8.9–10.3)
Chloride: 107 mmol/L (ref 98–111)
Creatinine, Ser: 0.7 mg/dL (ref 0.44–1.00)
GFR, Estimated: >60 mL/min (ref >60)
Glucose, Bld: 82 mg/dL (ref 70–99)
Potassium: 4.4 mmol/L (ref 3.5–5.1)
Sodium: 138 mmol/L (ref 135–145)
Total Bilirubin: 0.5 mg/dL (ref 0.3–1.2)
Total Protein: 8.3 g/dL — ABNORMAL HIGH (ref 6.5–8.1)

## 2020-12-21 LAB — POCT URINALYSIS DIPSTICK, ED / UC
Glucose, UA: 100 mg/dL — AB
Ketones, ur: 15 mg/dL — AB
Nitrite: POSITIVE — AB
Protein, ur: >=300 mg/dL — AB
Specific Gravity, Urine: 1.01 (ref 1.005–1.030)
Urobilinogen, UA: 4 mg/dL — ABNORMAL HIGH (ref 0.0–1.0)
pH: >=9 (ref 5.0–8.0)

## 2020-12-21 LAB — CBC WITH DIFFERENTIAL/PLATELET
Abs Immature Granulocytes: 0.01 10*3/uL (ref 0.00–0.07)
Basophils Absolute: 0.1 10*3/uL (ref 0.0–0.1)
Basophils Relative: 1 %
Eosinophils Absolute: 0 10*3/uL (ref 0.0–0.5)
Eosinophils Relative: 1 %
HCT: 33.3 % — ABNORMAL LOW (ref 36.0–46.0)
Hemoglobin: 8.9 g/dL — ABNORMAL LOW (ref 12.0–15.0)
Immature Granulocytes: 0 %
Lymphocytes Relative: 40 %
Lymphs Abs: 3.2 10*3/uL (ref 0.7–4.0)
MCH: 16.7 pg — ABNORMAL LOW (ref 26.0–34.0)
MCHC: 26.7 g/dL — ABNORMAL LOW (ref 30.0–36.0)
MCV: 62.5 fL — ABNORMAL LOW (ref 80.0–100.0)
Monocytes Absolute: 0.8 10*3/uL (ref 0.1–1.0)
Monocytes Relative: 10 %
Neutro Abs: 3.8 10*3/uL (ref 1.7–7.7)
Neutrophils Relative %: 48 %
Platelets: 307 10*3/uL (ref 150–400)
RBC: 5.33 MIL/uL — ABNORMAL HIGH (ref 3.87–5.11)
RDW: 20.2 % — ABNORMAL HIGH (ref 11.5–15.5)
WBC: 7.8 10*3/uL (ref 4.0–10.5)
nRBC: 0 % (ref 0.0–0.2)

## 2020-12-21 LAB — POC URINE PREG, ED: Preg Test, Ur: NEGATIVE

## 2020-12-21 MED ORDER — MEGESTROL ACETATE 40 MG PO TABS: 40.0000 mg | ORAL_TABLET | Freq: Two times a day (BID) | ORAL | 0 refills | Status: DC

## 2020-12-21 NOTE — Discharge Instructions (Addendum)
Follow-up with your gynecologist first thing next week and go to the emergency department if your symptoms worsen at any point

## 2020-12-21 NOTE — ED Notes (Signed)
Patient's urine has clots in bloody specimen.  Showed specimen to rachel, pa

## 2020-12-21 NOTE — ED Triage Notes (Signed)
Pt present vaginal bleeding from cycle and now she is passing blood clots.  Pt state this happen two days ago. Pt state her blood is real thin and runny.

## 2020-12-21 NOTE — ED Provider Notes (Signed)
[facility]    CSN: [id number] Arrival date & time: 12/21/20  1352      History   Chief Complaint Chief Complaint  Patient presents with  . Vaginal Bleeding         HPI Rachael Sanchez is a 20 y.o. female.   Patient presenting today with 2-day history of heavy vaginal bleeding with large clots passing, 8 out of 10 lower abdominal pain.  States it is almost time for her menstrual cycle to begin but a few days early.  Has had issues with heavy periods in the past but never this bad.  Not on any sort of contraception at this time.  Denies any chance of pregnancy or exposures to STDs, dysuria, hematuria, vaginal discharge itching or irritation, fever, chills, nausea, vomiting, bowel changes.  So far not trying anything over-the-counter for symptoms.     History reviewed. No pertinent past medical history.  There are no problems to display for this patient.   History reviewed. No pertinent surgical history.  OB History   No obstetric history on file.      Home Medications    Prior to Admission medications   Medication Sig Start Date End Date Taking? Authorizing Provider  megestrol (MEGACE) 40 MG tablet Take 1 tablet (40 mg total) by mouth 2 (two) times daily. 12/21/20  Yes Particia Nearing, PA-C  acetaminophen (TYLENOL) 500 MG tablet Take 500 mg by mouth every 6 (six) hours as needed for mild pain, fever or headache.    [provider]  amoxicillin-clavulanate (AUGMENTIN) 875-125 MG tablet Take 1 tablet by mouth every 12 (twelve) hours. 08/02/20   Jodell Cipro, PA-C  dextromethorphan-guaiFENesin (MUCINEX DM) 30-600 MG 12hr tablet Take 1 tablet by mouth 2 (two) times daily. 07/08/20   Wieters, Hallie C, PA-C  fluticasone (FLONASE) 50 MCG/ACT nasal spray Place 2 sprays into both nostrils daily. 03/15/18   Wurst, [place], PA-C  ibuprofen (ADVIL) 800 MG tablet Take 1 tablet (800 mg total) by mouth 3 (three) times daily. 07/08/20   Wieters, Junius Creamer, PA-C    Family History Family History  Problem Relation Age of Onset  . Healthy Mother   . Diabetes Father     Social History Social History   Tobacco Use  . Smoking status: Never Smoker  . Smokeless tobacco: Never Used  Vaping Use  . Vaping Use: Never used  Substance Use Topics  . Alcohol use: Yes  . Drug use: Yes    Types: Marijuana     Allergies   Patient has no known allergies.   Review of Systems Review of Systems Per HPI  Physical Exam Triage Vital Signs ED Triage Vitals  Enc Vitals Group     BP 12/21/20 1446 116/70     Pulse Rate 12/21/20 1446 88     Resp 12/21/20 1446 16     Temp 12/21/20 1446 98.6 F (37 C)     Temp Source 12/21/20 1446 Oral     SpO2 12/21/20 1446 100 %     Weight --      Height --      Head Circumference --      Peak Flow --      Pain Score 12/21/20 1444 7     Pain Loc --      Pain Edu? --      Excl. in GC? --    No data found.  Updated Vital Signs BP 116/70 (BP Location: Left Arm)  Pulse 88   Temp 98.6 F (37 C) (Oral)   Resp 16   LMP 12/21/2020   SpO2 100%   Visual Acuity Right Eye Distance:   Left Eye Distance:   Bilateral Distance:    Right Eye Near:   Left Eye Near:    Bilateral Near:     Physical Exam Vitals and nursing note reviewed.  Constitutional:      Appearance: Normal appearance. She is not ill-appearing.  HENT:     Head: Atraumatic.     Mouth/Throat:     Mouth: Mucous membranes are moist.  Eyes:     Extraocular Movements: Extraocular movements intact.     Conjunctiva/sclera: Conjunctivae normal.  Cardiovascular:     Rate and Rhythm: Normal rate and regular rhythm.     Heart sounds: Normal heart sounds.  Pulmonary:     Effort: Pulmonary effort is normal.     Breath sounds: Normal breath sounds.  Abdominal:     General: Bowel sounds are normal. There is no distension.     Palpations: Abdomen is soft.     Tenderness: There is abdominal tenderness. There is right CVA tenderness. There  is no left CVA tenderness or guarding.  Genitourinary:    Comments: GU exam deferred to GYN, self swab performed Musculoskeletal:        General: Normal range of motion.     Cervical back: Normal range of motion and neck supple.  Skin:    General: Skin is warm and dry.  Neurological:     Mental Status: She is alert and oriented to person, place, and time.  Psychiatric:        Mood and Affect: Mood normal.        Thought Content: Thought content normal.        Judgment: Judgment normal.    UC Treatments / Results  Labs (all labs ordered are listed, but only abnormal results are displayed) Labs Reviewed  POCT URINALYSIS DIPSTICK, ED / UC - Abnormal; Notable for the following components:      Result Value   Glucose, UA 100 (*)    Bilirubin Urine LARGE (*)    Ketones, ur 15 (*)    Hgb urine dipstick LARGE (*)    Protein, ur >=300 (*)    Urobilinogen, UA 4.0 (*)    Nitrite POSITIVE (*)    Leukocytes,Ua LARGE (*)    All other components within normal limits  URINE CULTURE  CBC WITH DIFFERENTIAL/PLATELET  COMPREHENSIVE METABOLIC PANEL  POC URINE PREG, ED  CERVICOVAGINAL ANCILLARY ONLY    EKG   Radiology No results found.  Procedures Procedures (including critical care time)  Medications Ordered in UC Medications - No data to display  Initial Impression / Assessment and Plan / UC Course  I have reviewed the triage vital signs and the nursing notes.  Pertinent labs & imaging results that were available during my care of the patient were reviewed by me and considered in my medical decision making (see chart for details).     Vital signs very reassuring today, lower abdomen diffusely tender to palpation but no distention.  Urine sample results skewed due to the amount of blood, copious bright red blood and large clots present in urine cup on visualization.  We will send out for urine culture for further discernment on this and awaiting vaginal swab.  We will also run  basic labs for further rule out.  Discussed with her that given the severity of her  abdominal pain and our inability to image her abdomen and pelvis in the setting that she is recommended to go to the emergency department which she declines at this time.  She wishes to try the Megace discussed as an option in clinic to help slow the bleeding down while awaiting the test results.  She knows to go to the emergency department if her symptoms are worsening at any point.  Close follow-up with OB/GYN first thing next week recommended strongly.  Final Clinical Impressions(s) / UC Diagnoses   Final diagnoses:  Dysfunctional uterine bleeding  Lower abdominal pain     Discharge Instructions     Follow-up with your gynecologist first thing next week and go to the emergency department if your symptoms worsen at any point    ED Prescriptions    Medication Sig Dispense Auth. Provider   megestrol (MEGACE) 40 MG tablet Take 1 tablet (40 mg total) by mouth 2 (two) times daily. 20 tablet Particia Nearing, [place]     PDMP not reviewed this encounter.   Particia Nearing, [place] 12/21/20 1646

## 2020-12-23 LAB — CERVICOVAGINAL ANCILLARY ONLY
Bacterial Vaginitis (gardnerella): NEGATIVE
Candida Glabrata: NEGATIVE
Candida Vaginitis: NEGATIVE
Chlamydia: NEGATIVE
Comment: NEGATIVE
Comment: NEGATIVE
Comment: NEGATIVE
Comment: NEGATIVE
Comment: NEGATIVE
Comment: NORMAL
Neisseria Gonorrhea: NEGATIVE
Trichomonas: NEGATIVE

## 2020-12-24 ENCOUNTER — Telehealth (HOSPITAL_COMMUNITY): Payer: Self-pay

## 2020-12-24 ENCOUNTER — Other Ambulatory Visit: Payer: Self-pay

## 2020-12-24 ENCOUNTER — Ambulatory Visit: Payer: Self-pay | Admitting: *Deleted

## 2020-12-24 LAB — URINE CULTURE: Culture: >=100000 — AB

## 2020-12-24 MED ORDER — NITROFURANTOIN MONOHYD MACRO 100 MG PO CAPS: 100.0000 mg | ORAL_CAPSULE | Freq: Two times a day (BID) | ORAL | 0 refills | Status: DC

## 2020-12-24 NOTE — Telephone Encounter (Signed)
[place], RN from [facility] called this Clinical research associate and stated that pt presenting to them today, and this pt had tested positive for UTI at this Urgent Care and was not prescribed any antibiotics. Writer notified Roosvelt Maser, [place] who called in antibiotics for pt at this time. [facility] RN called back and notified that medication has been ordered for pt. Verbalized understanding.

## 2020-12-24 NOTE — Progress Notes (Signed)
error 

## 2020-12-24 NOTE — Telephone Encounter (Signed)
Urine culture positive for UTI, macrobid sent

## 2020-12-30 ENCOUNTER — Ambulatory Visit: Payer: Self-pay | Admitting: Nurse Practitioner

## 2020-12-30 DIAGNOSIS — Z0289 Encounter for other administrative examinations: Secondary | ICD-10-CM

## 2021-07-02 ENCOUNTER — Ambulatory Visit
Admission: EM | Admit: 2021-07-02 | Discharge: 2021-07-02 | Disposition: A | Discharge status: Home / Self Care | Payer: Self-pay | Attending: Physician Assistant | Admitting: Physician Assistant

## 2021-07-02 ENCOUNTER — Encounter: Payer: Self-pay | Admitting: Emergency Medicine

## 2021-07-02 ENCOUNTER — Other Ambulatory Visit: Payer: Self-pay

## 2021-07-02 DIAGNOSIS — Z3201 Encounter for pregnancy test, result positive: Secondary | ICD-10-CM

## 2021-07-02 DIAGNOSIS — Z3A01 Less than 8 weeks gestation of pregnancy: Secondary | ICD-10-CM

## 2021-07-02 LAB — POCT URINE PREGNANCY: Preg Test, Ur: POSITIVE — AB

## 2021-07-02 NOTE — ED Provider Notes (Signed)
[facility]    CSN: [id number] Arrival date & time: 07/02/21  1216      History   Chief Complaint Chief Complaint  Patient presents with   Pregnancy Test    HPI Rachael Sanchez is a 20 y.o. female.   Here today for confirmation testing for pregnancy.  She reports that she took an at home test last night that was positive and to be seen at [facility] she needed confirmation in office.  She denies any other concerns at this time.  The history is provided by the patient.   History reviewed. No pertinent past medical history.  There are no problems to display for this patient.   History reviewed. No pertinent surgical history.  OB History     Gravida  1   Para      Term      Preterm      AB      Living         SAB      IAB      Ectopic      Multiple      Live Births               Home Medications    Prior to Admission medications   Medication Sig Start Date End Date Taking? Authorizing Provider  acetaminophen (TYLENOL) 500 MG tablet Take 500 mg by mouth every 6 (six) hours as needed for mild pain, fever or headache.    [provider]  amoxicillin-clavulanate (AUGMENTIN) 875-125 MG tablet Take 1 tablet by mouth every 12 (twelve) hours. 08/02/20   Ward, Tylene Fantasia, PA-C  dextromethorphan-guaiFENesin (MUCINEX DM) 30-600 MG 12hr tablet Take 1 tablet by mouth 2 (two) times daily. 07/08/20   Wieters, Hallie C, PA-C  fluticasone (FLONASE) 50 MCG/ACT nasal spray Place 2 sprays into both nostrils daily. 03/15/18   Wurst, [place], PA-C  ibuprofen (ADVIL) 800 MG tablet Take 1 tablet (800 mg total) by mouth 3 (three) times daily. 07/08/20   Wieters, Hallie C, PA-C  megestrol (MEGACE) 40 MG tablet Take 1 tablet (40 mg total) by mouth 2 (two) times daily. 12/21/20   Particia Nearing, PA-C  nitrofurantoin, macrocrystal-monohydrate, (MACROBID) 100 MG capsule Take 1 capsule (100 mg total) by mouth 2 (two) times daily. 12/24/20    Particia Nearing, PA-C    Family History Family History  Problem Relation Age of Onset   Healthy Mother    Diabetes Father     Social History Social History   Tobacco Use   Smoking status: Never   Smokeless tobacco: Never  Vaping Use   Vaping Use: Never used  Substance Use Topics   Alcohol use: Yes   Drug use: Yes    Types: Marijuana     Allergies   Patient has no known allergies.   Review of Systems Review of Systems  Constitutional:  Negative for chills and fever.  Eyes:  Negative for discharge and redness.  Respiratory:  Negative for shortness of breath.   Gastrointestinal:  Negative for abdominal pain, nausea and vomiting.  Genitourinary:  Positive for menstrual problem, vaginal bleeding and vaginal discharge.    Physical Exam Triage Vital Signs ED Triage Vitals [07/02/21 1343]  Enc Vitals Group     BP 127/76     Pulse Rate (!) 109     Resp      Temp 98.5 F (36.9 C)     Temp Source Oral     SpO2  96 %     Weight 138 lb (62.6 kg)     Height 5\' 6"  (1.676 m)     Head Circumference      Peak Flow      Pain Score 0     Pain Loc      Pain Edu?      Excl. in GC?    No data found.  Updated Vital Signs BP 127/76 (BP Location: Left Arm)    Pulse (!) 109    Temp 98.5 F (36.9 C) (Oral)    Ht 5\' 6"  (1.676 m)    Wt 138 lb (62.6 kg)    LMP 05/31/2021 (Exact Date)    SpO2 96%    BMI 22.27 kg/m      Physical Exam Vitals and nursing note reviewed.  Constitutional:      General: She is not in acute distress.    Appearance: Normal appearance. She is not ill-appearing.  HENT:     Head: Normocephalic and atraumatic.  Eyes:     Conjunctiva/sclera: Conjunctivae normal.  Cardiovascular:     Rate and Rhythm: Normal rate.  Pulmonary:     Effort: Pulmonary effort is normal.  Neurological:     Mental Status: She is alert.  Psychiatric:        Mood and Affect: Mood normal.        Behavior: Behavior normal.        Thought Content: Thought content  normal.     UC Treatments / Results  Labs (all labs ordered are listed, but only abnormal results are displayed) Labs Reviewed  POCT URINE PREGNANCY - Abnormal; Notable for the following components:      Result Value   Preg Test, Ur Positive (*)    All other components within normal limits    EKG   Radiology No results found.  Procedures Procedures (including critical care time)  Medications Ordered in UC Medications - No data to display  Initial Impression / Assessment and Plan / UC Course  I have reviewed the triage vital signs and the nursing notes.  Pertinent labs & imaging results that were available during my care of the patient were reviewed by me and considered in my medical decision making (see chart for details).    Pregnancy test positive in office.  Recommended follow-up with OB.  Final Clinical Impressions(s) / UC Diagnoses   Final diagnoses:  Less than [redacted] weeks gestation of pregnancy   Discharge Instructions   None    ED Prescriptions   None    PDMP not reviewed this encounter.   , PA-C 07/02/21 1359

## 2021-07-02 NOTE — ED Triage Notes (Signed)
Patient requesting a confirmation pregnancy test.  Patient took a test yesterday, home test was positive.

## 2021-07-20 NOTE — L&D Delivery Note (Signed)
OB/GYN Faculty Practice Delivery Note  Rachael Sanchez is a 21 y.o. G1P0 s/p VD at [redacted]w[redacted]d. She was admitted for eIOL.   ROM: 4h 52m with clear fluid GBS Status: Pos, PCN ppx   Maximum Maternal Temperature: 98.8  Labor Progress: Initial SVE: 2/50/-2. She then progressed to complete.   Delivery Date/Time: 8/16 @0736   Delivery: Called to room and patient was complete and pushing. Head delivered direct OP. No nuchal cord present. Shoulder and body delivered in usual fashion. Infant with spontaneous cry, placed on mother's abdomen, dried and stimulated. Cord clamped x 2 after 1-minute delay, and cut by FOB. Cord blood drawn. Placenta delivered spontaneously with gentle cord traction. Fundus firm with massage and Pitocin. Labia, perineum, vagina, and cervix inspected and found to have a 2nd degree perineal laceration. There was increased uterine bleeding. TXA and 800 mcg rectal cytotec given. Lower uterine segment was cleared of clots; uterine bleeding continued and Dr. was called 2/2 PPH. IM methergine given. Lower uterine segment massage performed. JAYDA inserted to wall suction which appeared to decreased rate of bleeding. Plan to check CBC in 2 hours and attempted to remove JAYDA in 1 hours.    Baby Weight: pending  Placenta: 3 vessel, intact. Sent to L&D Complications: PPH (TXA, rectal cyto, methergine, LUS massage, JAYDA) Lacerations: 2nd degree perineal laceration repaired with 3.0 vicryl EBL: 1357 mL Analgesia: Epidural   Infant:  APGAR (1 MIN): 9   APGAR (5 MINS): 9    Melora Menon Autry-Lott, DO OB Fellow, Faculty [facility], [facility] 03/04/2022, 8:46 AM

## 2021-07-21 ENCOUNTER — Ambulatory Visit (HOSPITAL_COMMUNITY)
Admission: EM | Admit: 2021-07-21 | Discharge: 2021-07-21 | Payer: Medicaid Other | Attending: Family Medicine | Admitting: Family Medicine

## 2021-07-21 ENCOUNTER — Other Ambulatory Visit: Payer: Self-pay

## 2021-07-21 ENCOUNTER — Encounter (HOSPITAL_COMMUNITY): Payer: Self-pay | Admitting: Emergency Medicine

## 2021-07-21 ENCOUNTER — Inpatient Hospital Stay (HOSPITAL_COMMUNITY)
Admission: EM | Admit: 2021-07-21 | Discharge: 2021-07-21 | Disposition: A | Discharge status: Home / Self Care | Payer: Medicaid Other | Attending: Obstetrics & Gynecology | Admitting: Obstetrics & Gynecology

## 2021-07-21 ENCOUNTER — Inpatient Hospital Stay (HOSPITAL_COMMUNITY): Payer: Medicaid Other

## 2021-07-21 DIAGNOSIS — O219 Vomiting of pregnancy, unspecified: Secondary | ICD-10-CM | POA: Diagnosis not present

## 2021-07-21 DIAGNOSIS — N898 Other specified noninflammatory disorders of vagina: Secondary | ICD-10-CM | POA: Insufficient documentation

## 2021-07-21 DIAGNOSIS — Z751 Person awaiting admission to adequate facility elsewhere: Secondary | ICD-10-CM

## 2021-07-21 DIAGNOSIS — R1030 Lower abdominal pain, unspecified: Secondary | ICD-10-CM | POA: Diagnosis not present

## 2021-07-21 DIAGNOSIS — N83202 Unspecified ovarian cyst, left side: Secondary | ICD-10-CM | POA: Insufficient documentation

## 2021-07-21 DIAGNOSIS — O3481 Maternal care for other abnormalities of pelvic organs, first trimester: Secondary | ICD-10-CM | POA: Insufficient documentation

## 2021-07-21 DIAGNOSIS — O26891 Other specified pregnancy related conditions, first trimester: Secondary | ICD-10-CM | POA: Diagnosis not present

## 2021-07-21 DIAGNOSIS — R1010 Upper abdominal pain, unspecified: Secondary | ICD-10-CM | POA: Insufficient documentation

## 2021-07-21 DIAGNOSIS — O209 Hemorrhage in early pregnancy, unspecified: Secondary | ICD-10-CM

## 2021-07-21 DIAGNOSIS — Z3A00 Weeks of gestation of pregnancy not specified: Secondary | ICD-10-CM

## 2021-07-21 DIAGNOSIS — R109 Unspecified abdominal pain: Secondary | ICD-10-CM | POA: Diagnosis not present

## 2021-07-21 DIAGNOSIS — Z3A01 Less than 8 weeks gestation of pregnancy: Secondary | ICD-10-CM

## 2021-07-21 LAB — WET PREP, GENITAL
Sperm: NONE SEEN
Trich, Wet Prep: NONE SEEN
Yeast Wet Prep HPF POC: NONE SEEN

## 2021-07-21 LAB — HIV ANTIBODY (ROUTINE TESTING W REFLEX): HIV Screen 4th Generation wRfx: NONREACTIVE

## 2021-07-21 LAB — URINALYSIS, ROUTINE W REFLEX MICROSCOPIC
Bilirubin Urine: NEGATIVE
Glucose, UA: NEGATIVE mg/dL
Hgb urine dipstick: NEGATIVE
Ketones, ur: 5 mg/dL — AB
Leukocytes,Ua: NEGATIVE
Nitrite: NEGATIVE
Protein, ur: NEGATIVE mg/dL
Specific Gravity, Urine: 1.008 (ref 1.005–1.030)
pH: 6 (ref 5.0–8.0)

## 2021-07-21 LAB — CBC
HCT: 33.6 % — ABNORMAL LOW (ref 36.0–46.0)
Hemoglobin: 9.2 g/dL — ABNORMAL LOW (ref 12.0–15.0)
MCH: 16.7 pg — ABNORMAL LOW (ref 26.0–34.0)
MCHC: 27.4 g/dL — ABNORMAL LOW (ref 30.0–36.0)
MCV: 61.1 fL — ABNORMAL LOW (ref 80.0–100.0)
Platelets: 391 10*3/uL (ref 150–400)
RBC: 5.5 MIL/uL — ABNORMAL HIGH (ref 3.87–5.11)
RDW: 23.9 % — ABNORMAL HIGH (ref 11.5–15.5)
WBC: 7.8 10*3/uL (ref 4.0–10.5)
nRBC: 0 % (ref 0.0–0.2)

## 2021-07-21 LAB — HCG, QUANTITATIVE, PREGNANCY: hCG, Beta Chain, Quant, S: 66713 m[IU]/mL — ABNORMAL HIGH (ref <5)

## 2021-07-21 LAB — ABO/RH: ABO/RH(D): O POS

## 2021-07-21 MED ORDER — ACETAMINOPHEN 500 MG PO TABS: 1000.0000 mg | ORAL_TABLET | Freq: Four times a day (QID) | ORAL | 0 refills | Status: DC | PRN

## 2021-07-21 MED ORDER — PANTOPRAZOLE SODIUM 20 MG PO TBEC
20.0000 mg | DELAYED_RELEASE_TABLET | Freq: Once | ORAL | Status: AC | PRN
  Administered 2021-07-21: 20 mg via ORAL
  Filled 2021-07-21: qty 1

## 2021-07-21 MED ORDER — ACETAMINOPHEN 500 MG PO TABS
1000.0000 mg | ORAL_TABLET | Freq: Once | ORAL | Status: AC
  Administered 2021-07-21: 1000 mg via ORAL
  Filled 2021-07-21: qty 2

## 2021-07-21 MED ORDER — PROMETHAZINE HCL 25 MG PO TABS: 25.0000 mg | ORAL_TABLET | Freq: Four times a day (QID) | ORAL | 2 refills | Status: DC | PRN

## 2021-07-21 MED ORDER — PANTOPRAZOLE SODIUM 20 MG PO TBEC: 20.0000 mg | DELAYED_RELEASE_TABLET | Freq: Every day | ORAL | 2 refills | Status: DC

## 2021-07-21 MED ORDER — PROMETHAZINE HCL 25 MG PO TABS
25.0000 mg | ORAL_TABLET | Freq: Once | ORAL | Status: AC | PRN
  Administered 2021-07-21: 25 mg via ORAL
  Filled 2021-07-21: qty 1

## 2021-07-21 NOTE — ED Notes (Signed)
Patient is being discharged from the Urgent Care and sent to the Emergency Department via personal vehicle . Per Dr Marlinda Mike, patient is in need of higher level of care due to abdominal pain & vaginal pain in early pregnancy. Patient is aware and verbalizes understanding of plan of care. There were no vitals filed for this visit.

## 2021-07-21 NOTE — ED Triage Notes (Signed)
Pt coming from [facility], complaint of abdominal pain and bleeding that started yesterday, worse today.

## 2021-07-21 NOTE — MAU Provider Note (Signed)
Chief Complaint: Abdominal Pain ([redacted] weeks pregnant)   Event Date/Time   First Provider Initiated Contact with Patient 07/21/21 1857     SUBJECTIVE HPI: Rachael Sanchez is a 21 y.o. G1P0 at [redacted]w[redacted]d who presents to Maternity Admissions reporting: Bloody discharge, low abd pain and upper abdominal pain with inspiration. Pt stated pain began yesterday around 0400. Patient states pain is 10/10 and continuous. Reports occasional N/V. Pt has not tried to alleviate pain with medication.  Vaginal Bleeding: Pink spotting. Denies passage of tissue or clots. Dizziness: Denies  O POS  Pain Location: Upper and lower abdomen Quality: Cramping Severity: 10/10 on pain scale at worst.  8/10 now Duration: 1 day Course: Unchanged Context: Early pregnant.  IUP not verified. Timing: Constant Modifying factors: Nothing.  Has not tried anything for pain. Associated signs and symptoms: Negative for fever, chills, urinary complaints.  Positive for vaginal discharge, nausea, vomiting and spotting.  History reviewed. No pertinent past medical history. OB History  Gravida Para Term Preterm AB Living  1            SAB IAB Ectopic Multiple Live Births               # Outcome Date GA Lbr Len/2nd Weight Sex Delivery Anes PTL Lv  1 Current            History reviewed. No pertinent surgical history. Social History   Socioeconomic History   Marital status: Single    Spouse name: Not on file   Number of children: Not on file   Years of education: Not on file   Highest education level: Not on file  Occupational History   Not on file  Tobacco Use   Smoking status: Never   Smokeless tobacco: Never  Vaping Use   Vaping Use: Never used  Substance and Sexual Activity   Alcohol use: Yes   Drug use: Yes    Types: Marijuana   Sexual activity: Yes    Birth control/protection: None  Other Topics Concern   Not on file  Social History Narrative   Not on file   Social Determinants of Health   Financial  Resource Strain: Not on file  Food Insecurity: Not on file  Transportation Needs: Not on file  Physical Activity: Not on file  Stress: Not on file  Social Connections: Not on file  Intimate Partner Violence: Not on file   No current facility-administered medications on file prior to encounter.   Current Outpatient Medications on File Prior to Encounter  Medication Sig Dispense Refill   amoxicillin-clavulanate (AUGMENTIN) 875-125 MG tablet Take 1 tablet by mouth every 12 (twelve) hours. 14 tablet 0   dextromethorphan-guaiFENesin (MUCINEX DM) 30-600 MG 12hr tablet Take 1 tablet by mouth 2 (two) times daily. 20 tablet 0   fluticasone (FLONASE) 50 MCG/ACT nasal spray Place 2 sprays into both nostrils daily. 16 g 0   ibuprofen (ADVIL) 800 MG tablet Take 1 tablet (800 mg total) by mouth 3 (three) times daily. 21 tablet 0   megestrol (MEGACE) 40 MG tablet Take 1 tablet (40 mg total) by mouth 2 (two) times daily. 20 tablet 0   nitrofurantoin, macrocrystal-monohydrate, (MACROBID) 100 MG capsule Take 1 capsule (100 mg total) by mouth 2 (two) times daily. 10 capsule 0   No Known Allergies  I have reviewed the past Medical Hx, Surgical Hx, Social Hx, Allergies and Medications.   Review of Systems  Constitutional:  Negative for chills and fever.  Gastrointestinal:  Positive  for nausea and vomiting. Negative for abdominal pain, constipation and diarrhea.  Genitourinary:  Positive for vaginal bleeding and vaginal discharge. Negative for dysuria, flank pain, frequency and hematuria.   OBJECTIVE Patient Vitals for the past 24 hrs:  BP Temp Temp src Pulse Resp SpO2 Height Weight  07/21/21 1839 126/80 -- -- 91 -- -- -- --  07/21/21 1723 115/69 98.3 F (36.8 C) Oral 86 18 100 % -- --  07/21/21 1719 -- -- -- -- -- -- 5\' 6"  (1.676 m) 62.7 kg  07/21/21 1645 117/80 98.6 F (37 C) Oral 94 18 100 % -- --   Constitutional: Well-developed, well-nourished female in no acute distress.  Cardiovascular:  normal rate Respiratory: normal rate and effort.  GI: Abd soft, non-tender.  MS: Extremities nontender, no edema, normal ROM Neurologic: Alert and oriented x 4.  GU: Neg CVAT.  SPECULUM EXAM: NEFG, moderate amount of blood-tinged, mildly malodorous yellow discharge, no active bleeding noted, cervix slightly friable.  Visually closed.  BIMANUAL: cervix closed; uterus top-normal size, no adnexal tenderness or masses. No CMT.  LAB RESULTS Results for orders placed or performed during the hospital encounter of 07/21/21 (from the past 24 hour(s))  hCG, quantitative, pregnancy     Status: Abnormal   Collection Time: 07/21/21  5:45 PM  Result Value Ref Range   hCG, Beta Chain, Quant, S 66,713 (H) <5 mIU/mL  CBC     Status: Abnormal   Collection Time: 07/21/21  5:45 PM  Result Value Ref Range   WBC 7.8 4.0 - 10.5 K/uL   RBC 5.50 (H) 3.87 - 5.11 MIL/uL   Hemoglobin 9.2 (L) 12.0 - 15.0 g/dL   HCT 33.6 (L) 36.0 - 46.0 %   MCV 61.1 (L) 80.0 - 100.0 fL   MCH 16.7 (L) 26.0 - 34.0 pg   MCHC 27.4 (L) 30.0 - 36.0 g/dL   RDW 23.9 (H) 11.5 - 15.5 %   Platelets 391 150 - 400 K/uL   nRBC 0.0 0.0 - 0.2 %  ABO/Rh     Status: None   Collection Time: 07/21/21  5:45 PM  Result Value Ref Range   ABO/RH(D) O POS    No rh immune globuloin      NOT A RH IMMUNE GLOBULIN CANDIDATE, PT RH POSITIVE Performed at [facility], [address], [place], [place] [address]   HIV Antibody (routine testing w rflx)     Status: None   Collection Time: 07/21/21  5:45 PM  Result Value Ref Range   HIV Screen 4th Generation wRfx Non Reactive Non Reactive  Urinalysis, Routine w reflex microscopic Urine, Clean Catch     Status: Abnormal   Collection Time: 07/21/21  6:00 PM  Result Value Ref Range   Color, Urine STRAW (A) YELLOW   APPearance CLEAR CLEAR   Specific Gravity, Urine 1.008 1.005 - 1.030   pH 6.0 5.0 - 8.0   Glucose, UA NEGATIVE NEGATIVE mg/dL   Hgb urine dipstick NEGATIVE NEGATIVE   Bilirubin Urine  NEGATIVE NEGATIVE   Ketones, ur 5 (A) NEGATIVE mg/dL   Protein, ur NEGATIVE NEGATIVE mg/dL   Nitrite NEGATIVE NEGATIVE   Leukocytes,Ua NEGATIVE NEGATIVE  Wet prep, genital     Status: Abnormal   Collection Time: 07/21/21  7:30 PM  Result Value Ref Range   Yeast Wet Prep HPF POC NONE SEEN NONE SEEN   Trich, Wet Prep NONE SEEN NONE SEEN   Clue Cells Wet Prep HPF POC MANY (A) NONE  SEEN   WBC, Wet Prep HPF POC RARE (A) <10   Sperm NONE SEEN     IMAGING US OB Comp Less 14 Wks  Result Date: 07/21/2021 CLINICAL DATA:  Pregnant patient in first-trimester pregnancy with lower abdominal pain and vaginal bleeding for 1 day. LMP 05/31/2021, gestational age by LMP 7 weeks 2 days. EXAM: OBSTETRIC <14 WK ULTRASOUND TECHNIQUE: Transabdominal ultrasound was performed for evaluation of the gestation as well as the maternal uterus and adnexal regions. COMPARISON:  None. FINDINGS: Intrauterine gestational sac: Single Yolk sac:  Visualized. Embryo:  Visualized. Cardiac Activity: Visualized. Heart Rate: 135 bpm CRL:   9 mm   6 w 6 d                  [place] EDC: 03/10/2022 Subchorionic hemorrhage:  None visualized. Maternal uterus/adnexae: The right ovary is visualized and is normal. There is a 2.5 cm corpus luteal cyst in the left ovary, ovarian blood flow is seen. No adnexal mass. No pelvic free fluid. IMPRESSION: 1. Single live intrauterine pregnancy estimated gestational age [redacted] weeks 6 days by crown-rump length for ultrasound [facility] 02/28/2022. 2. No subchorionic hemorrhage. Electronically Signed   By: Keith Rake M.D.   On: 07/21/2021 20:07    MAU COURSE CBC, Quant, ABO/Rh, ultrasound, wet prep and GC/chlamydia culture, UA  MDM Pain and bleeding in early pregnancy with normal intrauterine pregnancy and hemodynamically stable.  Pain possibly due to corpus luteum cyst and or BV.  Recommend Tylenol for pain.  Rx Flagyl for BV, but patient may need to wait until nausea and vomiting of improved before starting  Flagyl.  Recommend taking Phenergan and having food before Flagyl doses.  Pain resolved with Tylenol and MAU.  Nausea and vomiting of pregnancy.  P.o. Phenergan given.  Patient tolerating p.o.'s in MAU.  Rx sent.  ASSESSMENT 1. Vaginal bleeding in pregnancy, first trimester   2. Abdominal pain during pregnancy in first trimester   3. Nausea/vomiting in pregnancy   4. [redacted] weeks gestation of pregnancy     PLAN Discharge home in stable condition. Bleeding in first trimester precautions Advance diet slowly. GC/chlamydia cultures pending.  Follow-up Information     Obstetrician of your choice Follow up.   Why: Start prenatal care        [facility] Follow up.   Specialty: Obstetrics and Gynecology Why: As needed in emergencies Contact information: [address] [id number] [place] [phone]               Allergies as of 07/21/2021   No Known Allergies      Medication List     STOP taking these medications    amoxicillin-clavulanate 875-125 MG tablet Commonly known as: AUGMENTIN   dextromethorphan-guaiFENesin 30-600 MG 12hr tablet Commonly known as: MUCINEX DM   fluticasone 50 MCG/ACT nasal spray Commonly known as: FLONASE   ibuprofen 800 MG tablet Commonly known as: ADVIL   megestrol 40 MG tablet Commonly known as: MEGACE   nitrofurantoin (macrocrystal-monohydrate) 100 MG capsule Commonly known as: MACROBID       TAKE these medications    acetaminophen 500 MG tablet Commonly known as: TYLENOL Take 2 tablets (1,000 mg total) by mouth every 6 (six) hours as needed for mild pain, fever or headache. What changed: how much to take   pantoprazole 20 MG tablet Commonly known as: PROTONIX Take 1 tablet (20 mg total) by mouth daily.   promethazine 25 MG tablet  Commonly known as: PHENERGAN Take 1 tablet (25 mg total) by mouth every 6 (six) hours as needed for nausea or vomiting.          Tamala Julian, [place], [place] 07/21/2021  9:02 PM  4

## 2021-07-21 NOTE — Discharge Instructions (Signed)
[facility]   [facility]             [address], [place], [place] [address] [phone]  [facility]                                                             [address], [address], [place], [place], [address] [phone]  [facility] [place]                                    [address], [address], [place], [place], [address] [phone]  [facility] [place] [address], [place], [place], [address] [phone]  [facility] [place]                                 [address], [place], [place], [address] [phone]  [facility]                                    [address], [place], [place], [address] [phone]  [facility] [address], [address], [place], [place], [address]                              [facility] [place] [place] [address], [place], [place], [address] [phone]  [facility]         Phone: [phone]  [facility]      Phone: [phone]   [facility]      Phone: [phone]  [facility]         Phone: [phone]   [facility]    Phone: [phone]  [facility]      Phone: [phone]  [facility]      Phone: [phone]  [facility]        Phone: [phone]  [facility]      Phone: [phone]  

## 2021-07-21 NOTE — ED Notes (Signed)
Report called to MAU charge RN

## 2021-07-21 NOTE — ED Triage Notes (Signed)
Pt presents with some back pain and some abdominal pain since yesterday; pt is about [redacted] weeks gestation confirmed by [facility]

## 2021-07-21 NOTE — MAU Note (Signed)
Rachael Sanchez is a 21 y.o. at [redacted]w[redacted]d here in MAU reporting: states since yesterday she has been having lower abdominal and back pain. Also started bleeding yesterday, states it was when she wiped. No bleeding today.  LMP: 05/31/2021  Onset of complaint: yesterday  Pain score: 8/10  Vitals:   07/21/21 1645 07/21/21 1723  BP: 117/80 115/69  Pulse: 94 86  Resp: 18 18  Temp: 98.6 F (37 C) 98.3 F (36.8 C)  SpO2: 100% 100%     Lab orders placed from triage: UA

## 2021-07-21 NOTE — ED Provider Notes (Signed)
Emergency Medicine Provider Triage Evaluation Note  Rachael Sanchez , a 21 y.o. female  was evaluated in triage.  Pt complains of vaginal bleeding and Sanchez abdominal pain.  This began yesterday.  Pain is worse today.  She is approximately [redacted] weeks pregnant by dates.  Had a positive pregnancy test on 07/02/21.  No fever or chills.  Review of Systems  Positive:  Negative: See above   Physical Exam  BP 117/80    Pulse 94    Temp 98.6 F (37 C) (Oral)    Resp 18    LMP 05/31/2021 (Exact Date)    SpO2 100%  Gen:   Awake, no distress   Resp:  Normal effort  MSK:   Moves extremities without difficulty  Other:  Sanchez abdominal tenderness  Medical Decision Making  Medically screening exam initiated at 4:55 PM.  Appropriate orders placed.  Rachael Sanchez was informed that the remainder of the evaluation will be completed by another provider, this initial triage assessment does not replace that evaluation, and the importance of remaining in the ED until their evaluation is complete.  Spoke with Rachael Sanchez in the MAU who agrees to accept the patient.  Vital signs stable and she is in no acute distress.  She is ready for transport.   Rachael Lower, PA-C 07/21/21 1656    Sloan Leiter, DO 07/22/21 0013

## 2021-07-22 LAB — GC/CHLAMYDIA PROBE AMP (~~LOC~~) NOT AT ARMC
Chlamydia: NEGATIVE
Comment: NEGATIVE
Comment: NORMAL
Neisseria Gonorrhea: NEGATIVE

## 2021-07-25 NOTE — ED Provider Notes (Signed)
Pt had waited here to be seen, but was in early pregnancy, bleeding and cramping, and likely was having miscarriage. We asked her to go be seen at the MAU   Zenia Resides, MD 07/25/21 [phone]

## 2021-08-13 ENCOUNTER — Telehealth (INDEPENDENT_AMBULATORY_CARE_PROVIDER_SITE_OTHER): Payer: Medicaid Other

## 2021-08-13 DIAGNOSIS — Z3400 Encounter for supervision of normal first pregnancy, unspecified trimester: Secondary | ICD-10-CM | POA: Insufficient documentation

## 2021-08-13 DIAGNOSIS — Z3480 Encounter for supervision of other normal pregnancy, unspecified trimester: Secondary | ICD-10-CM

## 2021-08-13 DIAGNOSIS — Z3A00 Weeks of gestation of pregnancy not specified: Secondary | ICD-10-CM

## 2021-08-13 MED ORDER — PRENATAL PLUS 27-1 MG PO TABS: 1.0000 | ORAL_TABLET | Freq: Every day | ORAL | 11 refills | Status: DC

## 2021-08-13 MED ORDER — BLOOD PRESSURE MONITORING DEVI: 1.0000 | 0 refills | Status: DC

## 2021-08-13 NOTE — Progress Notes (Signed)
New OB Intake  I connected with  Rachael Sanchez on 08/13/21 at  2:15 PM EST by MyChart Video Visit and verified that I am speaking with the correct person using two identifiers. Nurse is located at [facility] and pt is located at home.  I discussed the limitations, risks, security and privacy concerns of performing an evaluation and management service by telephone and the availability of in person appointments. I also discussed with the patient that there may be a patient responsible charge related to this service. The patient expressed understanding and agreed to proceed.  I explained I am completing New OB Intake today. We discussed her EDD of 03/07/22 that is based on LMP of 05/31/21. Pt is G1/P0. I reviewed her allergies, medications, Medical/Surgical/OB history, and appropriate screenings. I informed her of [facility] services. Based on history, this is a/an  pregnancy uncomplicated .   Patient Active Problem List   Diagnosis Date Noted   Supervision of other normal pregnancy, antepartum 08/13/2021    Concerns addressed today  Delivery Plans:  Plans to deliver at [facility].   MyChart/Babyscripts MyChart access verified. I explained pt will have some visits in office and some virtually. Babyscripts instructions given and order placed. Patient verifies receipt of registration text/e-mail. Account successfully created and app downloaded.  Blood Pressure Cuff  Blood pressure cuff ordered for patient to pick-up from [employer]. Explained after first prenatal appt pt will check weekly and document in Babyscripts.  Weight scale: Patient does / does not  have weight scale. Weight scale ordered for patient to pick up from [employer].   Anatomy US Explained first scheduled [place] will be around 19 weeks. Anatomy US scheduled for 03/27 at 1:45p. Pt notified to arrive at 01:30p.  Labs Discussed Avelina Laine genetic screening with patient. Would like both Panorama and Horizon drawn at new OB visit.  Routine prenatal labs needed.  Covid Vaccine Patient has not covid vaccine.   CenteringPregnancy Candidate? Declined If yes, offer as possibility  Mother/ Baby Dyad Candidate?   Accepted If yes, offer as possibility  Informed patient of [facility] website  and placed link in her AVS.   Social Determinants of Health Food Insecurity: Patient denies food insecurity. WIC Referral: Patient is interested in referral to [facility].  Transportation: Patient denies transportation needs. Childcare: Discussed no children allowed at ultrasound appointments. Offered childcare services; patient declines childcare services at this time.  Send link to Pregnancy Navigators   Placed OB Box on problem list and updated  First visit review I reviewed new OB appt with pt. I explained she will have a pelvic exam, ob bloodwork with genetic screening, and PAP smear. Explained pt will be seen by Dr. Crissie Reese at first visit; encounter routed to appropriate provider. Explained that patient will be seen by pregnancy navigator following visit with provider. [facility] information placed in AVS.   [name], CMA 08/13/2021  2:40 PM

## 2021-08-13 NOTE — Patient Instructions (Signed)
AREA PEDIATRIC/FAMILY PRACTICE PHYSICIANS  Central/[place]outheast [place] ([address]) [facility] Chambliss, MD; Eniola, MD; Hale, MD; Hensel, MD; McDiarmid, MD; McIntyer, MD; Camdin Hegner, MD; Walden, MD [address] [place], [place] [address] [phone] Mon-Fri 8:30-12:30, 1:30-5:00 Providers come to see babies at [facility] Accepting Medicaid [facility] providers who accept newborns: Koirala, MD; Morrow, MD; Wolters, MD [address], [place], [place] [address] [phone] Mon-Fri 8:00-5:30 Babies seen by providers at [facility] Does NOT accept Medicaid Please call early in hospitalization for appointment (limited availability)  [facility] Mulberry, MD [address] [place], [place] [address] [phone] Mon, Tue, Thur, Fri 8:30-5:00, Wed 10:00-7:00 (closed 1-2pm) Babies seen by [facility] providers Accepting Medicaid Rubin - Pediatrician Rubin, MD [address], [place], [place] [address] [phone] Mon-Fri 8:30-5:00, Sat 8:30-12:00 Provider comes to see babies at [facility] Accepting Medicaid Must have been referred from current patients or contacted office prior to delivery [facility] ([facility]) Brown, MD; Chandler, MD; Ettefagh, MD; Grant, MD; Lester, MD; McCormick, MD; McQueen, MD; Prose, MD; Simha, MD; Stanley, MD; Stryffeler, NP; Tebben, NP [address], [place], [place] [address] [phone] Mon, Tue, Thur, Fri 8:30-5:30, Wed 9:30-5:30, Sat 8:30-12:30 Babies seen by [facility] providers Accepting Medicaid Only accepting infants of first-time parents or siblings of current patients Hospital discharge coordinator will make follow-up appointment Jack Amos [address], [place], [place]  [address] [phone]   Fax - [phone] [facility] [address], [place], [place]  [address] Phone - [phone]   Fax - [phone] Shilpa Gosrani [address], [place], [place]  [address] [phone]  [place] ([address]) [facility] Bates, MD; Brassfield, MD; Cooper, Cox, MD; MD; Davis, MD; Dovico, MD; Ettefaugh, MD; Little, MD; Lowe, MD; Keiffer, MD; Melvin, MD; Sumner, MD; Williams, MD [address], [place], [place] [address] [phone] Mon-Fri 8:30-5:00 (extended evenings Mon-Thur as needed), Sat-Sun 10:00-1:00 Providers come to see babies at [facility] Accepting Medicaid for families of first-time babies and families with all children in the household age 3 and under. Must register with office prior to making appointment (M-F only). [facility] Henson, NP; Knapp, MD; Lalonde, MD; Tysinger, PA [address] [place], [place] [address] [phone] Mon-Fri 8:00-5:00 Babies seen by providers at [facility] Does NOT accept Medicaid/Commercial Insurance Only [facility] ([facility])  Artis, MD; Barnes, MD; Bratton, MD; Coccaro, MD; Lockett Gardner, MD; Kramer, MD; Marshall, MD; Netherton, MD; Poleto, MD; Skinner, MD [address] [place], [place] [address] [phone] Mon-Fri 8:30-5:30, Sat (Oct.-Mar.) 9:00-1:00 Babies seen by providers at [facility] Accepting Medicaid  [place] ([address]) [facility] Reid, MD; Warner, MD [address], ,  [address] [phone] Mon-Fri 8:30-5:00, Sat 8:30-12:00 Providers come to see babies at [facility] Does NOT accept Medicaid [facility] Becker, PA; Hagler, MD; Scifres, PA; Sun, MD; Swayne, MD [address], ,  [address] [phone] Mon-Fri 8:00-5:00 Babies seen by providers at [facility] Does NOT accept Medicaid Only accepting babies of parents who  are patients Please call early in hospitalization for appointment (limited availability)  Pediatricians Clark, MD; Frye, MD; Kelleher, MD; Mack, NP; Miller, MD; O'Keller, MD; Patterson, NP; Pudlo, MD; Puzio, MD; Thomas, MD; Tucker, MD; Twiselton, MD [address], [place], [place] [address] [phone] Mon-Fri 8:00-5:00, Sat 9:00-12:00 Providers come to see babies at [facility] Does NOT accept Medicaid  [place] ([address]) [facility] providers accepting new patients: Brake, NP; Wharton, PA [address], [place], [place] [address] [phone] Mon-Fri 8:00-5:00 Babies seen by providers at [facility] Does NOT accept Medicaid Only accepting babies of parents who are patients Please call early in hospitalization for appointment (limited availability) [facility] Gay, MD; Quinlan, MD [address] [place], [place] [address] [phone] (press 1 to schedule appointment) Mon-Fri 8:00-5:00 Providers come to see babies at [facility] Does NOT accept Medicaid [facility] Mazer, MD [address] [place], [place] [address] [phone] Mon-Fri 8:30-5:00 (lunch 12:30-1:00), extended hours by appointment only Wed 5:00-6:30 Babies seen by [facility] providers Accepting Medicaid [facility] Banks, MD; Jordan, MD; Koberlein, MD [address], [place], [place] [address] [phone] Mon-Fri 8:00-5:00 Babies seen by [facility] providers Does NOT accept Medicaid [facility] Parker, MD; Hunter, MD; Wallace, DO [address] [place], [place] [address] [phone] Mon-Fri 8:00-5:00 Babies seen by [facility] providers Does NOT accept Medicaid [facility] Brandon, PA; Brecken, PA; Christy, NP; Dees, MD; DeClaire, MD; DeWeese, MD; Hansen, NP; Mills, NP; Parrish, NP; Smoot, NP; Summer, MD; Vapne,  MD [address] [place], [place] [address] [phone] Mon-Fri 8:30-5:00, Sat 10:00-1:00 Providers come to see babies at [facility] Does NOT accept Medicaid Free prenatal information session Tuesdays at 4:45pm [facility] Bouska, MD; Gordon, PA; Jeffery, PA; Weber, PA [address] [place] [address] [phone] Mon-Fri 7:30-5:30 Babies seen by [facility] providers [facility] [address], [place], [place]  [address] [phone]   Fax - [phone]  [place] ([address] & [address]) [facility] [place] [address] [place], [place] [address] [phone] Mon-Thur 8:00-6:00 Providers come to see babies at [facility] Accepting Medicaid [facility] Anderson, NP; Badger, MD; Beal, PA; Spencer, PA [address] [place], [place] [address] [phone] Mon-Thur 7:30-7:30, Fri 7:30-4:30 Babies seen by [facility] providers Accepting Medicaid [facility] Agbuya, MD; Klett, NP; Romgoolam, MD [address], [place], [place] [address] [phone] Mon-Fri 8:30-5:00, Sat 8:30-12:00 Providers come to see babies at [facility] Accepting Medicaid Must have "Meet & Greet" appointment at office prior to delivery [facility] ([facility]) McCord, MD; Wallace, MD; Wood, MD [address], [place], [place] [address] [phone] Mon-Wed 8:00-6:00, Thur-Fri 8:00-5:00, Sat 9:00-12:00 Providers come to see babies at [facility] Does NOT accept Medicaid Only accepting siblings of current patients [facility]  [address], [place], [place]  [address] [phone]   Fax - [phone] [facility] [address], [place], [place]  [address] [phone]   Fax -  [phone]  [place] ([address] & [address]) [facility] Cirigliano, DO; Matthews, DO [address] [place], [place] [address] [phone] Mon-Fri 7:00-5:00 Babies seen by [facility] providers Does NOT accept Medicaid [facility] Briscoe, MD; Howley, PA; Moreira, PA [address], [place], [place] [address] [phone] Mon-Fri 8:00-5:00 Babies seen by [facility] providers Accepting Medicaid [facility] Boyd, MD; Church, PA; Jones, NP; Osborn, PA [address] [place] [address], ,  [address] [phone] Mon-Fri 8:00-5:00 Babies seen by providers at [facility] Accepting Medicaid  [place] ([address]) [facility] [place] Wendling, DO [address] [place], [place] [address] [phone] Mon-Fri 8:00-5:00 Babies seen by [facility] providers Does NOT accept Medicaid Limited availability, please call early in hospitalization to schedule follow-up [facility] Calderon, PA; Cummings, MD; Dillard, MD; Martin, PA; Olson, MD; VanDeven, PA [address], [place], [place] [address] [phone] Mon-Fri 8:30-5:00, Sat 9:00-12:00 Babies seen by providers at [facility] Accepting Medicaid Please register online then schedule online or call office [email or web address] Hunter, NP; Kumar, MD; Martin [place], PA [address], [place], [place] [address] [phone] Mon-Fri 8:00-5:00 Babies seen by providers at [facility] Accepting Medicaid [facility] ([facility]) [address], MD; Kristi Fleenor, NP; West, MD [address], [place], [place] [address] [phone] Mon-Fri 8:00-5:30, Sat&Sun by appointment (phones open at  8:30) Babies seen by [facility] providers Accepting Medicaid Must be a first-time baby or sibling of current patient [facility] [place]  [address], [place], [place]  [address] [phone]   Fax - [phone]  [place] ([address]) [facility] Brown, PA; Cowen, PA; Rice, MD; Helton, PA; Spry, MD [address] [place], [place] [address] [phone] Mon-Thur 8:00-7:00, Fri 8:00-5:00, Sat 8:00-12:00, Sun 9:00-12:00 Babies seen by [facility] providers Accepting Medicaid [facility] Coe-Goins, MD; Marshall, MD; Pierre-Louis, MD [address] [place] [address], [place], [place] [address] [phone] Mon-Thur 8:00-5:00 Babies seen by providers at [facility] Accepting Medicaid [facility] Bratton, MD; Coe-Goins, MD; Hayes, MD; Lewis, MD; List, MD; Lott, MD; Marshall, MD; Moran, MD; O'Kimora Stankovic, MD; Pierre-Louis, MD; Pitonzo, MD; Scholer, MD; Spangle, MD [address] [place], [place] [address] [phone] Mon-Fri 8:00-5:30, Sat (Oct.-Mar.) 9:00-1:00 Babies seen by providers at [facility] Accepting Medicaid Must fill out new patient packet, available online at [email or web address] [facility] ([facility]) Friddle, NP; Harris, NP; Kelly, NP; [place], MD; Melvin, PA; Poth, MD; Ramadoss, MD; Stanton, NP [address], [place], [place] [address] [phone] Mon-Thur 8:00-5:30, Fri 8:00-5:00 Babies seen by providers at [facility] Accepting Medicaid  [place] ([address]) [facility] Dixon, PA; Ely, MD; Pickard, MD; Tapia, PA [address], [place], [place] [address] [phone] Mon-Fri 8:00-5:00 Babies seen by providers at [facility] Accepting Medicaid   [place] ([address]) [facility] [place] Masneri, DO; Meyers, MD; Nelson, PA [address], [place], [place] [address] [phone] Mon-Fri 8:00-5:00 Babies seen by providers at [facility] Does NOT accept Medicaid Limited appointment availability, please call early in hospitalization  [facility] [place] Kunedd, DO; McGowen, MD [address], [place], [place] [address] [phone] Mon-Fri 8:00-5:00 Babies seen by [facility] providers Does NOT accept Medicaid [facility] [place] Cameron, MD; MacDonald, MD; Michaels, PA; Nayak, MD [address] [place] [address], [place], [place] [address] [phone] Mon-Fri 8:00-5:00 After hours clinic ([address] [place], [place] [address]) [phone] Mon-Fri 5:00-8:00, Sat 12:00-6:00, Sun 10:00-4:00 Babies seen by [facility] providers Accepting Medicaid [facility] [place] [address], [place], [place]  [address] [phone]   Fax - [phone]  [place] ([address]) [facility] [place] [facility] Andy, MD [address], [place], [place] [address] [phone] Mon-Fri 8:00-5:00 Babies seen by [facility] providers Does NOT accept Medicaid [facility] [place] ([facility] [place]) Eksir, MD [address], [place], [place] [address] [phone] Mon-Thur 8:00-7:00, Fri 8:00-5:00, Sat 8:00-12:00 Babies seen by providers at [facility] Accepting Medicaid - but does not have vaccinations in office (must be received elsewhere) Limited availability, please call early in hospitalization  [place] ([address]) [facility]  Charlene Flemming, MD [address], [place] [address] [phone]  Fax [phone]  [facility]  Kimberly Newton, MD, Annamarie Streilein, PA, Carla Hampton, PA [address] [place], [place]  [address] [phone] [facility]  [address], [place], [place] [address] [phone] [address], [place], [place] [address] [phone] (West Office)  [facility] [address], [place], [place] [address] [phone] [facility] [address], [place], [place] [address] [phone] [facility] [address], [place], [place] [address] [phone] [facility] [address], [place], [place] [address] [phone] [facility] [address], [place], [place] [address] [phone] [facility] [address], [place], [place] [address] [phone] [facility]  [address], [place], [place] [address] [phone] Dr[facility] [address] [place] [address], [place], [place] [address] [phone] [facility] [address], [address], [place],  [address] [phone] [facility] [address], [place],  [address] [phone]  

## 2021-08-20 ENCOUNTER — Encounter: Payer: Self-pay | Admitting: Nurse Practitioner

## 2021-09-09 ENCOUNTER — Inpatient Hospital Stay (HOSPITAL_COMMUNITY)
Admission: AD | Admit: 2021-09-09 | Discharge: 2021-09-09 | Disposition: A | Discharge status: Home / Self Care | Payer: Medicaid Other | Attending: Obstetrics & Gynecology | Admitting: Obstetrics & Gynecology

## 2021-09-09 ENCOUNTER — Telehealth: Payer: Self-pay

## 2021-09-09 ENCOUNTER — Encounter (HOSPITAL_COMMUNITY): Payer: Self-pay | Admitting: Emergency Medicine

## 2021-09-09 ENCOUNTER — Telehealth: Payer: Medicaid Other | Admitting: Physician Assistant

## 2021-09-09 ENCOUNTER — Other Ambulatory Visit: Payer: Self-pay

## 2021-09-09 DIAGNOSIS — Z3A14 14 weeks gestation of pregnancy: Secondary | ICD-10-CM | POA: Diagnosis not present

## 2021-09-09 DIAGNOSIS — N898 Other specified noninflammatory disorders of vagina: Secondary | ICD-10-CM | POA: Insufficient documentation

## 2021-09-09 DIAGNOSIS — O219 Vomiting of pregnancy, unspecified: Secondary | ICD-10-CM | POA: Insufficient documentation

## 2021-09-09 DIAGNOSIS — R519 Headache, unspecified: Secondary | ICD-10-CM

## 2021-09-09 DIAGNOSIS — O26899 Other specified pregnancy related conditions, unspecified trimester: Secondary | ICD-10-CM

## 2021-09-09 DIAGNOSIS — O26892 Other specified pregnancy related conditions, second trimester: Secondary | ICD-10-CM | POA: Diagnosis present

## 2021-09-09 DIAGNOSIS — Z3480 Encounter for supervision of other normal pregnancy, unspecified trimester: Secondary | ICD-10-CM

## 2021-09-09 DIAGNOSIS — B9689 Other specified bacterial agents as the cause of diseases classified elsewhere: Secondary | ICD-10-CM

## 2021-09-09 LAB — URINALYSIS, ROUTINE W REFLEX MICROSCOPIC
Bilirubin Urine: NEGATIVE
Glucose, UA: NEGATIVE mg/dL
Hgb urine dipstick: NEGATIVE
Ketones, ur: NEGATIVE mg/dL
Leukocytes,Ua: NEGATIVE
Nitrite: NEGATIVE
Protein, ur: NEGATIVE mg/dL
Specific Gravity, Urine: 1.016 (ref 1.005–1.030)
pH: 7 (ref 5.0–8.0)

## 2021-09-09 LAB — I-STAT BETA HCG BLOOD, ED (NOT ORDERABLE): I-stat hCG, quantitative: >2000 m[IU]/mL — ABNORMAL HIGH (ref <5)

## 2021-09-09 LAB — CBC WITH DIFFERENTIAL/PLATELET
Abs Immature Granulocytes: 0.03 10*3/uL (ref 0.00–0.07)
Basophils Absolute: 0 10*3/uL (ref 0.0–0.1)
Basophils Relative: 0 %
Eosinophils Absolute: 0 10*3/uL (ref 0.0–0.5)
Eosinophils Relative: 0 %
HCT: 30.4 % — ABNORMAL LOW (ref 36.0–46.0)
Hemoglobin: 8.6 g/dL — ABNORMAL LOW (ref 12.0–15.0)
Immature Granulocytes: 0 %
Lymphocytes Relative: 17 %
Lymphs Abs: 1.6 10*3/uL (ref 0.7–4.0)
MCH: 18.3 pg — ABNORMAL LOW (ref 26.0–34.0)
MCHC: 28.3 g/dL — ABNORMAL LOW (ref 30.0–36.0)
MCV: 64.7 fL — ABNORMAL LOW (ref 80.0–100.0)
Monocytes Absolute: 0.4 10*3/uL (ref 0.1–1.0)
Monocytes Relative: 5 %
Neutro Abs: 7.3 10*3/uL (ref 1.7–7.7)
Neutrophils Relative %: 78 %
Platelets: 279 10*3/uL (ref 150–400)
RBC: 4.7 MIL/uL (ref 3.87–5.11)
RDW: 22.4 % — ABNORMAL HIGH (ref 11.5–15.5)
WBC: 9.4 10*3/uL (ref 4.0–10.5)
nRBC: 0 % (ref 0.0–0.2)

## 2021-09-09 LAB — COMPREHENSIVE METABOLIC PANEL
ALT: 10 U/L (ref 0–44)
AST: 16 U/L (ref 15–41)
Albumin: 3.2 g/dL — ABNORMAL LOW (ref 3.5–5.0)
Alkaline Phosphatase: 52 U/L (ref 38–126)
Anion gap: 7 (ref 5–15)
BUN: <5 mg/dL — ABNORMAL LOW (ref 6–20)
CO2: 23 mmol/L (ref 22–32)
Calcium: 9.2 mg/dL (ref 8.9–10.3)
Chloride: 105 mmol/L (ref 98–111)
Creatinine, Ser: 0.47 mg/dL (ref 0.44–1.00)
GFR, Estimated: >60 mL/min (ref >60)
Glucose, Bld: 101 mg/dL — ABNORMAL HIGH (ref 70–99)
Potassium: 3.8 mmol/L (ref 3.5–5.1)
Sodium: 135 mmol/L (ref 135–145)
Total Bilirubin: 0.2 mg/dL — ABNORMAL LOW (ref 0.3–1.2)
Total Protein: 7.1 g/dL (ref 6.5–8.1)

## 2021-09-09 LAB — WET PREP, GENITAL
Sperm: NONE SEEN
Trich, Wet Prep: NONE SEEN
WBC, Wet Prep HPF POC: <10 (ref <10)
Yeast Wet Prep HPF POC: NONE SEEN

## 2021-09-09 LAB — LIPASE, BLOOD: Lipase: 24 U/L (ref 11–51)

## 2021-09-09 MED ORDER — METRONIDAZOLE 0.75 % VA GEL
1.0000 | Freq: Every day | VAGINAL | 0 refills | Status: DC
Start: 2021-09-09 — End: 2021-09-29

## 2021-09-09 MED ORDER — CYCLOBENZAPRINE HCL 5 MG PO TABS
10.0000 mg | ORAL_TABLET | Freq: Once | ORAL | Status: AC
  Administered 2021-09-09: 10 mg via ORAL
  Filled 2021-09-09: qty 2

## 2021-09-09 MED ORDER — METOCLOPRAMIDE HCL 10 MG PO TABS
10.0000 mg | ORAL_TABLET | Freq: Once | ORAL | Status: AC
  Administered 2021-09-09: 10 mg via ORAL
  Filled 2021-09-09: qty 1

## 2021-09-09 MED ORDER — LACTATED RINGERS IV BOLUS
1000.0000 mL | Freq: Once | INTRAVENOUS | Status: AC
  Administered 2021-09-09: 1000 mL via INTRAVENOUS

## 2021-09-09 MED ORDER — METOCLOPRAMIDE HCL 10 MG PO TABS: 10.0000 mg | ORAL_TABLET | Freq: Four times a day (QID) | ORAL | 0 refills | Status: DC

## 2021-09-09 MED ORDER — CYCLOBENZAPRINE HCL 10 MG PO TABS: 10.0000 mg | ORAL_TABLET | Freq: Three times a day (TID) | ORAL | 0 refills | Status: DC | PRN

## 2021-09-09 MED ORDER — FAMOTIDINE IN NACL 20-0.9 MG/50ML-% IV SOLN
20.0000 mg | Freq: Once | INTRAVENOUS | Status: AC
  Administered 2021-09-09: 20 mg via INTRAVENOUS
  Filled 2021-09-09: qty 50

## 2021-09-09 MED ORDER — DIPHENHYDRAMINE HCL 50 MG/ML IJ SOLN
25.0000 mg | Freq: Once | INTRAMUSCULAR | Status: AC
  Administered 2021-09-09: 25 mg via INTRAVENOUS
  Filled 2021-09-09: qty 1

## 2021-09-09 NOTE — ED Provider Triage Note (Addendum)
Emergency Medicine Provider Triage Evaluation Note  Rachael Sanchez , a 21 y.o. female  was evaluated in triage.  Pt complains of abdominal pain, nausea and vomiting.  Patient states that today she woke up and had an episode of emesis that had a large amount of blood in it.  She describes the blood as bright red.  She has not had any further episodes of vomiting.  She denies having any recent nausea and vomiting symptoms.  She states that she has some associated right upper quadrant and right lower quadrant abdominal pain that is severe and constant.  She also has an associated severe headache for about 2 to 3 days that was sudden onset without neurological symptoms. She denies fevers, chills, cough, congestion, sore throat, shortness of breath, or chest pain. She does report a history of migraine headache but this is different than her usual.  She denies any vaginal bleeding, or discharge.  She is a G1, P0 and currently 14 weeks and 5 days pregnant.  She has not established with OB/GYN. She had a video call with one of our telemedicine ED providers and was referred to MAU.  Review of Systems  Positive: Abdominal pain, hematemesis, vomiting, headache Negative:   Physical Exam  BP (!) 142/88 (BP Location: Left Arm)    Pulse 93    Temp 98.6 F (37 C) (Oral)    Resp 16    LMP 05/31/2021 (Exact Date)    SpO2 100%  Gen:   Awake, no distress   Resp:  Normal effort  MSK:   Moves extremities without difficulty  Other:    Medical Decision Making  Medically screening exam initiated at 12:42 PM.  Appropriate orders placed.  Atha Hofacker was informed that the remainder of the evaluation will be completed by another provider, this initial triage assessment does not replace that evaluation, and the importance of remaining in the ED until their evaluation is complete.  Called MAU. She will be sent over to MAU, accepted by MAU APP.   Adolphus Birchwood, PA-C 09/09/21 1248    Adolphus Birchwood,  [place] 09/09/21 1249

## 2021-09-09 NOTE — ED Triage Notes (Signed)
Patient complains of headache x5 days and hematemesis that started today. Patient states she had been vomiting daily during her pregnancy until two weeks ago, then again today. Patient completed a video visit and was told to go to the MAU. Patient is G1P0.

## 2021-09-09 NOTE — Progress Notes (Signed)
Ms. Rachael Sanchez, diner are scheduled for a virtual visit with your provider today.    Just as we do with appointments in the office, we must obtain your consent to participate.  Your consent will be active for this visit and any virtual visit you may have with one of our providers in the next 365 days.    If you have a MyChart account, I can also send a copy of this consent to you electronically.  All virtual visits are billed to your insurance company just like a traditional visit in the office.  As this is a virtual visit, video technology does not allow for your provider to perform a traditional examination.  This may limit your provider's ability to fully assess your condition.  If your provider identifies any concerns that need to be evaluated in person or the need to arrange testing such as labs, EKG, etc, we will make arrangements to do so.    Although advances in technology are sophisticated, we cannot ensure that it will always work on either your end or our end.  If the connection with a video visit is poor, we may have to switch to a telephone visit.  With either a video or telephone visit, we are not always able to ensure that we have a secure connection.   I need to obtain your verbal consent now.   Are you willing to proceed with your visit today?   Rachael Sanchez has provided verbal consent on 09/09/2021 for a virtual visit (video or telephone).   Rachael Forth, PA-C 09/09/2021  11:04 AM   Date:  09/09/2021   ID:  Rachael Sanchez, DOB [birth year], MRN [id number]  Patient Location: Home Provider Location: Home Office   Participants: Patient and Provider for Visit and Wrap up  Method of visit: Video  Location of Patient: Home Location of Provider: Home Office Consent was obtain for visit over the video. Services rendered by provider: Visit was performed via video  A video enabled telemedicine application was used and I verified that I am speaking with the correct  person using two identifiers.  PCP:  Patient, No Pcp Per (Inactive)   Chief Complaint:  vomiting  History of Present Illness:    Rachael Sanchez is a 21 y.o. female with history as stated below. Presents video telehealth for an acute care visit.  Pt reports she is [redacted]W[redacted]D.  She reports she has not yet established with an OB/GYN.  G1P0.  Pt reports she had a virtual visit on 1/25.   Pt reports she has been vomiting x 1 hour this morning and noticed some blood with emesis.  She denies vaginal bleeding.  Pt reports she has had some "side pain" this morning.   She is also endorsing severe headache x2-3 days.  Denies fever, chills, cough, congestion.  She denies vision changes.    Pt reports hx of migraine headache, but this headache is much worse.   Modifying factors include: Tylenol without relief of symptoms.  No other aggravating or relieving factors.  No other c/o.  Pt reports she did urinate this morning and described it as pale yellow.    Past Medical, Surgical, Social History, Allergies, and Medications have been Reviewed.  Patient Active Problem List   Diagnosis Date Noted   Supervision of other normal pregnancy, antepartum 08/13/2021    Social History   Tobacco Use   Smoking status: Never   Smokeless tobacco: Never  Substance Use Topics   Alcohol use: Yes  Current Outpatient Medications:    acetaminophen (TYLENOL) 500 MG tablet, Take 2 tablets (1,000 mg total) by mouth every 6 (six) hours as needed for mild pain, fever or headache., Disp: 30 tablet, Rfl: 0   Blood Pressure Monitoring DEVI, 1 each by Does not apply route once a week., Disp: 1 each, Rfl: 0   pantoprazole (PROTONIX) 20 MG tablet, Take 1 tablet (20 mg total) by mouth daily., Disp: 30 tablet, Rfl: 2   prenatal vitamin w/FE, FA (PRENATAL 1 + 1) 27-1 MG TABS tablet, Take 1 tablet by mouth daily at 12 noon., Disp: 30 tablet, Rfl: 11   promethazine (PHENERGAN) 25 MG tablet, Take 1 tablet (25 mg  total) by mouth every 6 (six) hours as needed for nausea or vomiting., Disp: 30 tablet, Rfl: 2   No Known Allergies   Review of Systems  Constitutional:  Negative for chills and fever.  HENT:  Negative for congestion, ear pain and sore throat.   Eyes:  Negative for blurred vision and double vision.  Respiratory:  Negative for cough, shortness of breath and wheezing.   Cardiovascular:  Negative for chest pain, palpitations and leg swelling.  Gastrointestinal:  Positive for abdominal pain, nausea and vomiting. Negative for diarrhea.  Genitourinary:  Negative for dysuria.  Musculoskeletal:  Negative for myalgias.  Skin:  Negative for rash.  Neurological:  Positive for headaches. Negative for loss of consciousness and weakness.  Psychiatric/Behavioral:  The patient is not nervous/anxious.   See HPI for history of present illness.  Physical Exam Constitutional:      General: She is not in acute distress.    Appearance: Normal appearance. She is not ill-appearing.     Comments: Patient Crying  HENT:     Head: Normocephalic and atraumatic.     Nose: Congestion present.  Eyes:     Extraocular Movements: Extraocular movements intact.  Pulmonary:     Effort: Pulmonary effort is normal.  Musculoskeletal:        General: Normal range of motion.     Cervical back: Normal range of motion.  Skin:    Coloration: Skin is not pale.  Neurological:     General: No focal deficit present.     Mental Status: She is alert. Mental status is at baseline.  Psychiatric:        Mood and Affect: Mood normal.              A&P  1. Nausea and vomiting in pregnancy  - associated with hematemesis and abdominal pain  - no vaginal bleeding.    2. Acute intractable headache, unspecified headache type  - worst headache of life, no vision changes   - concern for severe headache and vomiting in the setting of pregnancy.  Pt will need face-to-face exam, work-up and treatment. Referred to the MAU  immediately.    Patient voiced understanding and agreement to plan.   Time:   Today, I have spent 15 minutes with the patient with telehealth technology discussing the above problems, reviewing the chart, previous notes, medications and orders.    Tests Ordered: No orders of the defined types were placed in this encounter.   Medication Changes: No orders of the defined types were placed in this encounter.    Disposition:  Follow up IMMEDIATELY in the MAU at [facility], Abigail Butts, PA-C  09/09/2021 11:04 AM

## 2021-09-09 NOTE — Patient Instructions (Signed)
1. Nausea and vomiting in pregnancy  - associated with hematemesis and abdominal pain  - no vaginal bleeding.    2. Acute intractable headache, unspecified headache type  - worst headache of life, no vision changes   - concern for severe headache and vomiting in the setting of pregnancy.  Pt will need face-to-face exam, work-up and treatment. Referred to the MAU immediately.

## 2021-09-09 NOTE — MAU Note (Signed)
Pt is here with n/v. Pt states this is the first time she has started throwing up in the last 2 weeks. Pt also notices blood in her vomit. Pt states she has not been able to keep anything down today. Pt states she has not taken any medication to relieve her n/v, but she has taken tylenol d/t headache.

## 2021-09-09 NOTE — MAU Provider Note (Cosign Needed)
History     CSN: [id number]  Arrival date and time: 09/09/21 1151   Event Date/Time   First Provider Initiated Contact with Patient 09/09/21 1331      Chief Complaint  Patient presents with   Hematemesis   Rachael Sanchez is a 21 y.o. G1P0 at 458w3d who [facility]  She presents today for Hematemesis.  She states threw up once today around 10am and it was yellow in color with some bright red blood.  She states she has not been taking any medication for her nausea.  reports some mucoid brownish discharge with wiping that is non-odorous and does not cause irritation.  Reports last sex was ~5 days ago and without pain or discomfort.  She also reports a HA that has been ongoing since Friday and states "my head hurts so bad, I can't even hold it up for too long."  She states she has been taking tylenol without relief.  She reports taking excedrin, in the past, with relief.    OB History     Gravida  1   Para      Term      Preterm      AB      Living         SAB      IAB      Ectopic      Multiple      Live Births              Past Medical History:  Diagnosis Date   Anemia     History reviewed. No pertinent surgical history.  Family History  Problem Relation Age of Onset   Healthy Mother    Diabetes Father     Social History   Tobacco Use   Smoking status: Never   Smokeless tobacco: Never  Vaping Use   Vaping Use: Never used  Substance Use Topics   Alcohol use: Not Currently   Drug use: Yes    Types: Marijuana    Allergies: No Known Allergies  Medications Prior to Admission  Medication Sig Dispense Refill Last Dose   acetaminophen (TYLENOL) 500 MG tablet Take 2 tablets (1,000 mg total) by mouth every 6 (six) hours as needed for mild pain, fever or headache. 30 tablet 0 09/08/2021 at 2200   Blood Pressure Monitoring DEVI 1 each by Does not apply route once a week. 1 each 0    cyclobenzaprine (FLEXERIL) 10 MG tablet Take 1  tablet (10 mg total) by mouth 3 (three) times daily as needed (headaches). 30 tablet 0    pantoprazole (PROTONIX) 20 MG tablet Take 1 tablet (20 mg total) by mouth daily. 30 tablet 2    prenatal vitamin w/FE, FA (PRENATAL 1 + 1) 27-1 MG TABS tablet Take 1 tablet by mouth daily at 12 noon. 30 tablet 11    promethazine (PHENERGAN) 25 MG tablet Take 1 tablet (25 mg total) by mouth every 6 (six) hours as needed for nausea or vomiting. 30 tablet 2     Review of Systems  Constitutional:  Negative for chills and fever.  Gastrointestinal:  Positive for nausea and vomiting. Negative for abdominal pain, constipation and diarrhea.  Genitourinary:  Positive for vaginal discharge. Negative for difficulty urinating, dysuria and vaginal bleeding.  Neurological:  Positive for headaches (10/10). Negative for dizziness and light-headedness.  Physical Exam   Blood pressure (!) 142/88, pulse 93, temperature 98.6 F (37 C), temperature source Oral, resp. rate 16, last  menstrual period 05/31/2021, SpO2 100 %.  Physical Exam Vitals reviewed.  Constitutional:      Appearance: Normal appearance.  HENT:     Head: Normocephalic and atraumatic.  Eyes:     Conjunctiva/sclera: Conjunctivae normal.  Cardiovascular:     Rate and Rhythm: Normal rate and regular rhythm.     Heart sounds: Normal heart sounds.  Pulmonary:     Effort: Pulmonary effort is normal.     Breath sounds: Normal breath sounds.  Abdominal:     General: Bowel sounds are normal.     Palpations: Abdomen is soft.     Tenderness: There is no abdominal tenderness.  Musculoskeletal:        General: Normal range of motion.     Cervical back: Normal range of motion.     Right lower leg: No edema.     Left lower leg: No edema.  Skin:    General: Skin is warm and dry.  Neurological:     Mental Status: She is alert and oriented to person, place, and time.  Psychiatric:        Mood and Affect: Mood normal.        Behavior: Behavior normal.         Thought Content: Thought content normal.    MAU Course  Procedures Results for orders placed or performed during the hospital encounter of 09/09/21 (from the past 24 hour(s))  Urinalysis, Routine w reflex microscopic Urine, Clean Catch     Status: Abnormal   Collection Time: 09/09/21 12:41 PM  Result Value Ref Range   Color, Urine YELLOW YELLOW   APPearance HAZY (A) CLEAR   Specific Gravity, Urine 1.016 1.005 - 1.030   pH 7.0 5.0 - 8.0   Glucose, UA NEGATIVE NEGATIVE mg/dL   Hgb urine dipstick NEGATIVE NEGATIVE   Bilirubin Urine NEGATIVE NEGATIVE   Ketones, ur NEGATIVE NEGATIVE mg/dL   Protein, ur NEGATIVE NEGATIVE mg/dL   Nitrite NEGATIVE NEGATIVE   Leukocytes,Ua NEGATIVE NEGATIVE  CBC with Differential     Status: Abnormal   Collection Time: 09/09/21 12:45 PM  Result Value Ref Range   WBC 9.4 4.0 - 10.5 K/uL   RBC 4.70 3.87 - 5.11 MIL/uL   Hemoglobin 8.6 (L) 12.0 - 15.0 g/dL   HCT 94.1 (L) 74.0 - 81.4 %   MCV 64.7 (L) 80.0 - 100.0 fL   MCH 18.3 (L) 26.0 - 34.0 pg   MCHC 28.3 (L) 30.0 - 36.0 g/dL   RDW 48.1 (H) 85.6 - 31.4 %   Platelets 279 150 - 400 K/uL   nRBC 0.0 0.0 - 0.2 %   Neutrophils Relative % 78 %   Neutro Abs 7.3 1.7 - 7.7 K/uL   Lymphocytes Relative 17 %   Lymphs Abs 1.6 0.7 - 4.0 K/uL   Monocytes Relative 5 %   Monocytes Absolute 0.4 0.1 - 1.0 K/uL   Eosinophils Relative 0 %   Eosinophils Absolute 0.0 0.0 - 0.5 K/uL   Basophils Relative 0 %   Basophils Absolute 0.0 0.0 - 0.1 K/uL   WBC Morphology MORPHOLOGY UNREMARKABLE    RBC Morphology MORPHOLOGY UNREMARKABLE    Smear Review MORPHOLOGY UNREMARKABLE    Immature Granulocytes 0 %   Abs Immature Granulocytes 0.03 0.00 - 0.07 K/uL  Comprehensive metabolic panel     Status: Abnormal   Collection Time: 09/09/21 12:45 PM  Result Value Ref Range   Sodium 135 135 - 145 mmol/L   Potassium 3.8 3.5 -  5.1 mmol/L   Chloride 105 98 - 111 mmol/L   CO2 23 22 - 32 mmol/L   Glucose, Bld 101 (H) 70 - 99 mg/dL   BUN  <5 (L) 6 - 20 mg/dL   Creatinine, Ser 0.78 0.44 - 1.00 mg/dL   Calcium 9.2 8.9 - 67.5 mg/dL   Total Protein 7.1 6.5 - 8.1 g/dL   Albumin 3.2 (L) 3.5 - 5.0 g/dL   AST 16 15 - 41 U/L   ALT 10 0 - 44 U/L   Alkaline Phosphatase 52 38 - 126 U/L   Total Bilirubin 0.2 (L) 0.3 - 1.2 mg/dL   GFR, Estimated >44 >92 mL/min   Anion gap 7 5 - 15  Lipase, blood     Status: None   Collection Time: 09/09/21 12:45 PM  Result Value Ref Range   Lipase 24 11 - 51 U/L  I-Stat beta hCG blood, ED     Status: Abnormal   Collection Time: 09/09/21 12:59 PM  Result Value Ref Range   I-stat hCG, quantitative >2,000.0 (H) <5 mIU/mL   Comment 3          Wet prep, genital     Status: Abnormal   Collection Time: 09/09/21  2:29 PM   Specimen: PATH Cytology Cervicovaginal Ancillary Only  Result Value Ref Range   Yeast Wet Prep HPF POC NONE SEEN NONE SEEN   Trich, Wet Prep NONE SEEN NONE SEEN   Clue Cells Wet Prep HPF POC PRESENT (A) NONE SEEN   WBC, Wet Prep HPF POC <10 <10   Sperm NONE SEEN      Patient informed that the ultrasound is considered a limited OB ultrasound and is not intended to be a complete ultrasound exam.  Patient also informed that the ultrasound is not being completed with the intent of assessing for fetal or placental anomalies or any pelvic abnormalities.  Explained that the purpose of todays ultrasound is to assess for  viability.  Patient acknowledges the purpose of the exam and the limitations of the study.         MDM Start IV LR Bolus  Antiemetic PPI Metoclopramide Benadryl BSUS Assessment and Plan  21 year old, G1P0  SIUP at 14.3 weeks Nausea/Vomiting Headache  -Reviewed POC with patient. -Exam performed.  -Start IV. Give LR -Discussed usage of Reglan for HA and nausea. -Will also give pepcid. -Will monitor and reassess  Cherre Robins 09/09/2021, 1:31 PM   Reassessment (3:14 PM)  -Patient states one incident of vomiting after provider left. No blood noted.   -Patient reports improvement in nausea and headache. -However states HA is now 6-7/10. -Will order benadryl add-on and reassess. -Discussed usage of oral medications if benadryl not effective. -Patient agreeable. -Will also return to perform BSUS.   Reassessment (3:13 PM)  -Patient reports HA now 5/10. -States nausea gone and desires to eat. -Will give crackers and juice. -Discussed treatment with flexeril for HA. Patient agreeable. -Discussed recommendation for neurology consult to evaluate recurrent HA. Patient reports having similar referrals in the past. -Provider to return to complete BSUS.  Reassessment (5:01 PM) -Flexeril given. Patient reports HA now 2/10. -Referral for neurology placed. -BSUS completed and dating c/w LMP. -FHR 142 by doppler. -Rx for reglan and metrogel sent to pharmacy on file.  -Patient to continue Protonix, Flexeril, and Phenergan as prescribed and as needed.  -Encouraged to call primary office or return to MAU if symptoms worsen or with the onset of new symptoms. -  Discharged to home in improved condition.  Cherre Robins MSN, CNM Advanced Practice Provider, [facility] [employer]

## 2021-09-09 NOTE — Telephone Encounter (Signed)
Patient called MFM and spoke with Revonda Standard with front office. Patient was upset on the phone so report was given to [facility] clinical staff Fleet Contras, RN. Called pt. Patient is crying when she answers the phone. Reports feeling very overwhelmed about being sick at home alone. Patient states she woke up this morning about 1 hour prior to call and was having severe nausea with episode of vomiting. Patient is very concerned because she saw small streak of blood in vomit. Reviewed precautions for when to go to MAU with blood in vomit. Reviewed the amount of blood she is reporting may be due to throat irritation. Reports no N/V for past 2 weeks. Does not have Phenergan because she is at [employer]. Pt also reports severe headache since Friday. Has taken Tylenol 1000mg  twice a day with minimal improvement. Recommended patient take this every 6 hours. Asked if patient can get to pharmacy for refills of Phenergan and Protonix. Pt says she can. Called pharmacy to have them prepare refills. Verbal order given by MD for Flexeril for headache. Instructed pt to take these medications when she picks them up and to rest. Told patient I will call back later today to see how she is feeling.

## 2021-09-10 LAB — GC/CHLAMYDIA PROBE AMP (~~LOC~~) NOT AT ARMC
Chlamydia: NEGATIVE
Comment: NEGATIVE
Comment: NORMAL
Neisseria Gonorrhea: NEGATIVE

## 2021-09-29 ENCOUNTER — Other Ambulatory Visit (HOSPITAL_COMMUNITY)
Admission: RE | Admit: 2021-09-29 | Discharge: 2021-09-29 | Disposition: A | Discharge status: Home / Self Care | Payer: Medicaid Other | Source: Ambulatory Visit | Attending: Family Medicine | Admitting: Family Medicine

## 2021-09-29 ENCOUNTER — Ambulatory Visit (INDEPENDENT_AMBULATORY_CARE_PROVIDER_SITE_OTHER): Payer: Medicaid Other | Admitting: Family Medicine

## 2021-09-29 ENCOUNTER — Encounter: Payer: Self-pay | Admitting: Family Medicine

## 2021-09-29 ENCOUNTER — Other Ambulatory Visit: Payer: Self-pay

## 2021-09-29 VITALS — BP 120/77 | HR 105 | Wt 147.0 lb

## 2021-09-29 DIAGNOSIS — Z3400 Encounter for supervision of normal first pregnancy, unspecified trimester: Secondary | ICD-10-CM

## 2021-09-29 DIAGNOSIS — Z3480 Encounter for supervision of other normal pregnancy, unspecified trimester: Secondary | ICD-10-CM

## 2021-09-29 DIAGNOSIS — O99012 Anemia complicating pregnancy, second trimester: Secondary | ICD-10-CM

## 2021-09-29 DIAGNOSIS — R7303 Prediabetes: Secondary | ICD-10-CM

## 2021-09-29 DIAGNOSIS — Z5941 Food insecurity: Secondary | ICD-10-CM | POA: Insufficient documentation

## 2021-09-29 DIAGNOSIS — O219 Vomiting of pregnancy, unspecified: Secondary | ICD-10-CM

## 2021-09-29 MED ORDER — ASPIRIN EC 81 MG PO TBEC: 81.0000 mg | DELAYED_RELEASE_TABLET | Freq: Every day | ORAL | 2 refills | Status: DC

## 2021-09-29 NOTE — Progress Notes (Signed)
? ? ?INITIAL PRENATAL VISIT- Mom+Baby Combined Care ? ?Subjective:  ? ?Rachael Sanchez is being seen today for her first obstetrical visit.  She is at [redacted]w[redacted]d gestation by LMP Her history is significant for smoker. FOB is involved in pregnancy. Patient does intend to breast feed. Pregnancy history fully reviewed. ? ?Patient reports no complaints. ? ?Indications for ASA therapy (per uptodate) ?One of the following: ?Previous pregnancy with preeclampsia, especially early onset and with an adverse outcome No ?Multifetal gestation No ?Chronic hypertension No ?Type 1 or 2 diabetes mellitus No ?Chronic kidney disease No ?Autoimmune disease (antiphospholipid syndrome, systemic lupus erythematosus) No ? ?Two or more of the following: ?Nulliparity Yes ?Obesity (body mass index >30 kg/m2) No ?Family history of preeclampsia in mother or sister No ?Age ?35 years No ?Sociodemographic characteristics (African American race, low socioeconomic level) Yes ?Personal risk factors (eg, previous pregnancy with low birth weight or small for gestational age infant, previous adverse pregnancy outcome [eg, stillbirth], interval >10 years between pregnancies) No ? ?Indications for early GDM screening  ?First-degree relative with diabetes No ?BMI >30kg/m2 No ?Age > 25 No ?Previous birth of an infant weighing ?4000 g No ?Gestational diabetes mellitus in a previous pregnancy No ?Glycated hemoglobin ?5.7 percent (39 mmol/mol), impaired glucose tolerance, or impaired fasting glucose on previous testing No ?High-risk race/ethnicity (eg, African American, Latino, Native American, [place] American, Pacific Islander) Yes ?Previous stillbirth of unknown cause No ?Maternal birthweight > 9 lbs No ?History of cardiovascular disease No ?Hypertension or on therapy for hypertension No ?High-density lipoprotein cholesterol level <35 mg/dL (0.90 mmol/L) and/or a triglyceride level >250 mg/dL (2.82 mmol/L) No ?Polycystic ovary syndrome No ?Physical inactivity  No ?Other clinical condition associated with insulin resistance (eg, severe obesity, acanthosis nigricans) No ?Current use of glucocorticoids No ? ? ?Early screening tests: FBS, A1C, Random CBG, glucose challenge ? ? ?Review of Systems:  ? ?Review of Systems ? ?Objective:  ? ? Obstetric History ?OB History  ?Gravida Para Term Preterm AB Living  ?1            ?SAB IAB Ectopic Multiple Live Births  ?           ?  ?# Outcome Date GA Lbr Len/2nd Weight Sex Delivery Anes PTL Lv  ?1 Current           ? ? ?Past Medical History:  ?Diagnosis Date  ? Anemia   ? ? ?No past surgical history on file. ? ?Current Outpatient Medications on File Prior to Visit  ?Medication Sig Dispense Refill  ? Blood Pressure Monitoring DEVI 1 each by Does not apply route once a week. 1 each 0  ? cyclobenzaprine (FLEXERIL) 10 MG tablet Take 1 tablet (10 mg total) by mouth 3 (three) times daily as needed (headaches). 30 tablet 0  ? metoCLOPramide (REGLAN) 10 MG tablet Take 1 tablet (10 mg total) by mouth every 6 (six) hours. 30 tablet 0  ? pantoprazole (PROTONIX) 20 MG tablet Take 1 tablet (20 mg total) by mouth daily. 30 tablet 2  ? promethazine (PHENERGAN) 25 MG tablet Take 1 tablet (25 mg total) by mouth every 6 (six) hours as needed for nausea or vomiting. 30 tablet 2  ? acetaminophen (TYLENOL) 500 MG tablet Take 2 tablets (1,000 mg total) by mouth every 6 (six) hours as needed for mild pain, fever or headache. (Patient not taking: Reported on 09/29/2021) 30 tablet 0  ? prenatal vitamin w/FE, FA (PRENATAL 1 + 1) 27-1 MG TABS tablet Take 1  tablet by mouth daily at 12 noon. (Patient not taking: Reported on 09/29/2021) 30 tablet 11  ? ?No current facility-administered medications on file prior to visit.  ? ? ?No Known Allergies ? ?Social History:  reports that she has never smoked. She has never used smokeless tobacco. She reports that she does not currently use alcohol. She reports current drug use. Drug: Marijuana. ? ?Family History  ?Problem  Relation Age of Onset  ? Healthy Mother   ? Diabetes Father   ? ? ?The following portions of the patient's history were reviewed and updated as appropriate: allergies, current medications, past family history, past medical history, past social history, past surgical history and problem list. ? ?Review of Systems ?Review of Systems  ?Constitutional:  Negative for chills and fever.  ?HENT:  Negative for congestion and sore throat.   ?Eyes:  Negative for pain and visual disturbance.  ?Respiratory:  Negative for cough, chest tightness and shortness of breath.   ?Cardiovascular:  Negative for chest pain.  ?Gastrointestinal:  Negative for abdominal pain, diarrhea, nausea and vomiting.  ?Endocrine: Negative for cold intolerance and heat intolerance.  ?Genitourinary:  Negative for dysuria and flank pain.  ?Musculoskeletal:  Negative for back pain.  ?Skin:  Negative for rash.  ?Allergic/Immunologic: Negative for food allergies.  ?Neurological:  Negative for dizziness and light-headedness.  ?Psychiatric/Behavioral:  Negative for agitation.    ? ? ?Physical Exam:  ?BP 120/77   Pulse (!) 105   Wt 147 lb (66.7 kg)   LMP 05/31/2021 (Exact Date)   BMI 23.73 kg/m?  ? ?Fetal Status: Fetal Heart Rate (bpm): 154   Movement: Present    ? ?CONSTITUTIONAL: Well-developed, well-nourished female in no acute distress.  ?HENT:  Normocephalic, atraumatic, External right and left ear normal. Oropharynx is clear and moist ?EYES: Conjunctivae normal. No scleral icterus.  ?NECK: Normal range of motion, supple, no masses.  Normal thyroid.  ?SKIN: Skin is warm and dry. No rash noted. Not diaphoretic. No erythema. No pallor. ?MUSCULOSKELETAL: Normal range of motion. No tenderness.  No cyanosis, clubbing, or edema.   ?NEUROLOGIC: Alert and oriented to person, place, and time. Normal muscle tone coordination.  ?PSYCHIATRIC: Normal mood and affect. Normal behavior. Normal judgment and thought content. ?CARDIOVASCULAR: Normal heart rate noted,  regular rhythm ?RESPIRATORY: Clear to auscultation bilaterally. Effort and breath sounds normal, no problems with respiration noted. ?BREASTS: Symmetric in size. No masses, skin changes, nipple drainage, or lymphadenopathy. ?ABDOMEN: Soft, normal bowel sounds, no distention noted.  No tenderness, rebound or guarding.  ?PELVIC: Normal appearing external genitalia; normal appearing vaginal mucosa and cervix.  No abnormal discharge noted.  Pap smear obtained.  Normal uterine size, no other palpable masses, no uterine or adnexal tenderness. ? ?  ?Assessment:  ? ? Pregnancy: G1P0  ?1. Supervision of other normal pregnancy, antepartum ?- GC/Chlamydia probe amp ([place])not at [facility] ?- Genetic Screening ?- CBC/D/Plt+RPR+Rh+ABO+RubIgG... ?- Culture, OB Urine ?- Hemoglobin A1c ?- Cytology - PAP( [place]) ? ? ?  ?Plan:  ? ?  ?Initial labs drawn. ?Prenatal vitamins. ?Problem list reviewed and updated. ?Reviewed in detail the nature of the practice with collaborative care  ?Discussed Mom+Baby model with prenatal care and then infant will received care at our office. Reviewed providers involved in care.  ?Genetic screening discussed: NIPS/First trimester screen/Quad/AFP requested. ?Role of ultrasound in pregnancy discussed; Anatomy US: requested. ?Amniocentesis discussed: not indicated. ?Follow up in 4 weeks. ?Discussed clinic routines, schedule of care and testing, genetic screening options, involvement of  students and residents under the direct supervision of APPs and doctors and presence of female providers. Pt verbalized understanding. ? ?Future Appointments  ?Date Time Provider [place]  ?10/03/2021  9:45 AM Genia Harold, MD [facility] None  ?10/13/2021  1:45 PM [facility] US5 [facility]  ?10/27/2021  9:15 AM Caren Macadam, MD [facility]  ? ? ? ?Caren Macadam, MD ?09/29/2021 ?12:27 PM ? ? ?

## 2021-09-30 ENCOUNTER — Encounter: Payer: Self-pay | Admitting: Family Medicine

## 2021-09-30 ENCOUNTER — Telehealth: Payer: Self-pay

## 2021-09-30 DIAGNOSIS — O99019 Anemia complicating pregnancy, unspecified trimester: Secondary | ICD-10-CM | POA: Insufficient documentation

## 2021-09-30 DIAGNOSIS — O0932 Supervision of pregnancy with insufficient antenatal care, second trimester: Secondary | ICD-10-CM | POA: Insufficient documentation

## 2021-09-30 DIAGNOSIS — R7303 Prediabetes: Secondary | ICD-10-CM | POA: Insufficient documentation

## 2021-09-30 LAB — CBC/D/PLT+RPR+RH+ABO+RUBIGG...
Antibody Screen: NEGATIVE
Basophils Absolute: 0 10*3/uL (ref 0.0–0.2)
Basos: 0 %
EOS (ABSOLUTE): 0 10*3/uL (ref 0.0–0.4)
Eos: 0 %
HCV Ab: NONREACTIVE
HIV Screen 4th Generation wRfx: NONREACTIVE
Hematocrit: 31.5 % — ABNORMAL LOW (ref 34.0–46.6)
Hemoglobin: 9.5 g/dL — ABNORMAL LOW (ref 11.1–15.9)
Hepatitis B Surface Ag: NEGATIVE
Immature Grans (Abs): 0.1 10*3/uL (ref 0.0–0.1)
Immature Granulocytes: 1 %
Lymphocytes Absolute: 2 10*3/uL (ref 0.7–3.1)
Lymphs: 24 %
MCH: 19.3 pg — ABNORMAL LOW (ref 26.6–33.0)
MCHC: 30.2 g/dL — ABNORMAL LOW (ref 31.5–35.7)
MCV: 64 fL — ABNORMAL LOW (ref 79–97)
Monocytes Absolute: 0.5 10*3/uL (ref 0.1–0.9)
Monocytes: 6 %
Neutrophils Absolute: 5.6 10*3/uL (ref 1.4–7.0)
Neutrophils: 69 %
Platelets: 340 10*3/uL (ref 150–450)
RBC: 4.93 x10E6/uL (ref 3.77–5.28)
RDW: 20.2 % — ABNORMAL HIGH (ref 11.7–15.4)
RPR Ser Ql: NONREACTIVE
Rh Factor: POSITIVE
Rubella Antibodies, IGG: 8.43 index (ref >0.99)
WBC: 8.2 10*3/uL (ref 3.4–10.8)

## 2021-09-30 LAB — HEMOGLOBIN A1C
Est. average glucose Bld gHb Est-mCnc: 117 mg/dL
Hgb A1c MFr Bld: 5.7 % — ABNORMAL HIGH (ref 4.8–5.6)

## 2021-09-30 LAB — CYTOLOGY - PAP: Diagnosis: NEGATIVE

## 2021-09-30 LAB — HCV INTERPRETATION

## 2021-09-30 MED ORDER — FERROUS SULFATE 325 (65 FE) MG PO TABS: 325.0000 mg | ORAL_TABLET | ORAL | 3 refills | Status: DC

## 2021-09-30 NOTE — Telephone Encounter (Addendum)
-----   Message from Federico Flake, MD sent at 09/30/2021  4:18 PM EDT ----- ?Prediabetes-- needs early 2h GTT, put in future orders ? ?Anemia- iron sent to pharmacy ? ?Left message to return call about results and f/u.  ? ?Zniya Cottone,RN  ?09/30/21 ?

## 2021-09-30 NOTE — Addendum Note (Signed)
Addended by: Geanie Berlin on: 09/30/2021 04:18 PM ? ? Modules accepted: Orders ? ?

## 2021-10-01 ENCOUNTER — Telehealth: Payer: Self-pay

## 2021-10-01 NOTE — Telephone Encounter (Signed)
Called pt. Reviewed results. Early 2 HR GTT scheduled for 10/08/21 at 10 AM. Also reviewed need for iron supplement. Instructed patient to take every other day with orange juice.  ?

## 2021-10-01 NOTE — Addendum Note (Signed)
Addended by: Louisa Second E on: 10/01/2021 12:21 PM ? ? Modules accepted: Orders ? ?

## 2021-10-01 NOTE — Telephone Encounter (Signed)
Cytology called and states PAP specimen was received with PAP and GC/CT urine requisition. No urine sample sent. RN notified. Original order canceled. Collected in February. Will not be needed. ?

## 2021-10-02 LAB — CULTURE, OB URINE

## 2021-10-02 LAB — URINE CULTURE, OB REFLEX
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
## 2021-10-03 ENCOUNTER — Ambulatory Visit: Payer: Medicaid Other | Admitting: Psychiatry

## 2021-10-03 ENCOUNTER — Encounter: Payer: Self-pay | Admitting: Psychiatry

## 2021-10-03 ENCOUNTER — Encounter: Payer: Self-pay | Admitting: *Deleted

## 2021-10-08 ENCOUNTER — Other Ambulatory Visit: Payer: Medicaid Other

## 2021-10-08 ENCOUNTER — Other Ambulatory Visit: Payer: Self-pay

## 2021-10-08 DIAGNOSIS — R7303 Prediabetes: Secondary | ICD-10-CM

## 2021-10-09 LAB — GLUCOSE TOLERANCE, 2 HOURS W/ 1HR
Glucose, 1 hour: 132 mg/dL (ref 70–179)
Glucose, 2 hour: 92 mg/dL (ref 70–152)
Glucose, Fasting: 75 mg/dL (ref 70–91)

## 2021-10-13 ENCOUNTER — Other Ambulatory Visit: Payer: Self-pay

## 2021-10-13 ENCOUNTER — Other Ambulatory Visit: Payer: Self-pay | Admitting: *Deleted

## 2021-10-13 ENCOUNTER — Ambulatory Visit: Payer: Medicaid Other | Attending: Family Medicine

## 2021-10-13 DIAGNOSIS — Z3A19 19 weeks gestation of pregnancy: Secondary | ICD-10-CM | POA: Diagnosis not present

## 2021-10-13 DIAGNOSIS — Z8742 Personal history of other diseases of the female genital tract: Secondary | ICD-10-CM

## 2021-10-13 DIAGNOSIS — Z3480 Encounter for supervision of other normal pregnancy, unspecified trimester: Secondary | ICD-10-CM | POA: Insufficient documentation

## 2021-10-13 DIAGNOSIS — O321XX0 Maternal care for breech presentation, not applicable or unspecified: Secondary | ICD-10-CM | POA: Insufficient documentation

## 2021-10-13 DIAGNOSIS — O4442 Low lying placenta NOS or without hemorrhage, second trimester: Secondary | ICD-10-CM | POA: Insufficient documentation

## 2021-10-13 DIAGNOSIS — Z363 Encounter for antenatal screening for malformations: Secondary | ICD-10-CM | POA: Diagnosis present

## 2021-10-14 ENCOUNTER — Encounter: Payer: Self-pay | Admitting: Family Medicine

## 2021-10-14 DIAGNOSIS — O4440 Low lying placenta NOS or without hemorrhage, unspecified trimester: Secondary | ICD-10-CM | POA: Insufficient documentation

## 2021-10-14 DIAGNOSIS — N856 Intrauterine synechiae: Secondary | ICD-10-CM | POA: Insufficient documentation

## 2021-10-15 ENCOUNTER — Telehealth: Payer: Self-pay | Admitting: Family Medicine

## 2021-10-15 ENCOUNTER — Telehealth: Payer: Self-pay | Admitting: *Deleted

## 2021-10-15 NOTE — Telephone Encounter (Signed)
Patient called in this morning, stated her BP was 144-117, I spoke to Joyce Copa and she said patient need to go to [facility],  I told the patient and she expressed she would go to get checked out,  ?

## 2021-10-15 NOTE — Telephone Encounter (Signed)
Received Babys[employer]s alert BP 144/117. Patient had also called front desk and reported BP 144/117 and instructed to go to hospital for evaluation per nurse. I called patient and she confirms BP and denies headache, edema or visual disturbances. States hasn't felt baby move yet this pregnancy and was told she may not. I asked if she has high blood pressures and she states usually fine. I asked her to repeat bp. She informed me she  had no way to hospital and has called EMS. I explained EMS will recheck her blood pressure and advise her. She voices understanding. ?Staci Acosta ?

## 2021-10-17 ENCOUNTER — Telehealth: Payer: Self-pay

## 2021-10-17 NOTE — Telephone Encounter (Signed)
Called pt to review [employer] carrier screening positive Alpha-Thalassemia. Recommended partner testing. Pt to pick up kit at next appt. Number for genetic counseling with Johnsie Cancel given.  ?

## 2021-10-23 NOTE — Progress Notes (Signed)
? ? ?  PRENATAL VISIT NOTE ? ?Subjective:  ?Rachael Sanchez is a 21 y.o. G1P0 at [redacted]w[redacted]d being seen today for ongoing prenatal care.  She is currently monitored for the following issues for this low-risk pregnancy and has Supervision of normal first pregnancy, antepartum; Nausea and vomiting in pregnancy; Food insecurity; Prediabetes; Anemia affecting pregnancy; Late prenatal care affecting pregnancy in second trimester; Uterine synechiae; and Low-lying placenta on their problem list. ? ?Patient reports no complaints.  Contractions: Not present. Vag. Bleeding: None.  Movement: Present. Denies leaking of fluid.  ? ?The following portions of the patient's history were reviewed and updated as appropriate: allergies, current medications, past family history, past medical history, past social history, past surgical history and problem list.  ? ?Objective:  ? ?Vitals:  ? 10/27/21 0916 10/27/21 1009  ?BP: (!) 85/64 124/72  ?Pulse: (!) 156 91  ?Weight: 154 lb (69.9 kg)   ? ? ?Fetal Status: Fetal Heart Rate (bpm): 144   Movement: Present    ? ?General:  Alert, oriented and cooperative. Patient is in no acute distress.  ?Skin: Skin is warm and dry. No rash noted.   ?Cardiovascular: Normal heart rate noted  ?Respiratory: Normal respiratory effort, no problems with respiration noted  ?Abdomen: Soft, gravid, appropriate for gestational age.  Pain/Pressure: Absent     ?Pelvic: Cervical exam deferred        ?Extremities: Normal range of motion.  Edema: None  ?Mental Status: Normal mood and affect. Normal behavior. Normal judgment and thought content.  ? ?Assessment and Plan:  ?Pregnancy: G1P0 at [redacted]w[redacted]d ?1. Supervision of normal first pregnancy, antepartum ?Up to date ?HR was initially elevated, repeat was well WNL ?Reviewed pregnancy restrictions/accommodations ?Encouraged to speak to HR and get paperwork ?- AFP, Serum, Open Spina Bifida ? ?2. Anemia affecting pregnancy in second trimester ?Taking FE, denies SE ?- CBC ? ?3. Late  prenatal care affecting pregnancy in second trimester ? ?4. Low-lying placenta ?Follow up US scheduled ? ?5. Headache in pregnancy, antepartum ?- cyclobenzaprine (FLEXERIL) 10 MG tablet; Take 1 tablet (10 mg total) by mouth 3 (three) times daily as needed (headaches).  Dispense: 30 tablet; Refill: 2 ? ? ?Preterm labor symptoms and general obstetric precautions including but not limited to vaginal bleeding, contractions, leaking of fluid and fetal movement were reviewed in detail with the patient. ?Please refer to After Visit Summary for other counseling recommendations.  ? ?Return in about 4 weeks (around 11/24/2021) for Mom+Baby Combined Care. ? ?Future Appointments  ?Date Time Provider Department Center  ?11/12/2021  8:15 AM [facility] NURSE [facility]  ?11/12/2021  8:30 AM [facility] US2 [facility]  ?11/25/2021  3:15 PM Crissie Reese Mary Sella, MD [facility]  ?12/17/2021  8:15 AM Crissie Reese Mary Sella, MD [facility]  ?12/17/2021  8:50 AM [facility] LAB [facility]  ? ? ?Federico Flake, MD ?

## 2021-10-27 ENCOUNTER — Encounter: Payer: Medicaid Other | Admitting: Family Medicine

## 2021-10-27 ENCOUNTER — Ambulatory Visit (INDEPENDENT_AMBULATORY_CARE_PROVIDER_SITE_OTHER): Payer: Medicaid Other | Admitting: Family Medicine

## 2021-10-27 VITALS — BP 124/72 | HR 91 | Wt 154.0 lb

## 2021-10-27 DIAGNOSIS — O26899 Other specified pregnancy related conditions, unspecified trimester: Secondary | ICD-10-CM

## 2021-10-27 DIAGNOSIS — O99012 Anemia complicating pregnancy, second trimester: Secondary | ICD-10-CM

## 2021-10-27 DIAGNOSIS — R519 Headache, unspecified: Secondary | ICD-10-CM

## 2021-10-27 DIAGNOSIS — Z3400 Encounter for supervision of normal first pregnancy, unspecified trimester: Secondary | ICD-10-CM

## 2021-10-27 DIAGNOSIS — O0932 Supervision of pregnancy with insufficient antenatal care, second trimester: Secondary | ICD-10-CM

## 2021-10-27 DIAGNOSIS — O4440 Low lying placenta NOS or without hemorrhage, unspecified trimester: Secondary | ICD-10-CM

## 2021-10-27 MED ORDER — CYCLOBENZAPRINE HCL 10 MG PO TABS: 10.0000 mg | ORAL_TABLET | Freq: Three times a day (TID) | ORAL | 2 refills | Status: DC | PRN

## 2021-10-27 NOTE — Progress Notes (Signed)
Patient reports daily headaches and heartburn.  ?

## 2021-10-29 ENCOUNTER — Encounter: Payer: Self-pay | Admitting: Family Medicine

## 2021-10-29 ENCOUNTER — Telehealth: Payer: Self-pay

## 2021-10-29 LAB — AFP, SERUM, OPEN SPINA BIFIDA
AFP MoM: 0.92
AFP Value: 66.1 ng/mL
Gest. Age on Collection Date: 21 weeks
Maternal Age At EDD: 22.7 yr
OSBR Risk 1 IN: 10000
Test Results:: NEGATIVE
Weight: 154 [lb_av]

## 2021-10-29 LAB — CBC
Hematocrit: 28.9 % — ABNORMAL LOW (ref 34.0–46.6)
Hemoglobin: 8.6 g/dL — ABNORMAL LOW (ref 11.1–15.9)
MCH: 19.5 pg — ABNORMAL LOW (ref 26.6–33.0)
MCHC: 29.8 g/dL — ABNORMAL LOW (ref 31.5–35.7)
MCV: 66 fL — ABNORMAL LOW (ref 79–97)
Platelets: 296 10*3/uL (ref 150–450)
RBC: 4.4 x10E6/uL (ref 3.77–5.28)
RDW: 19.8 % — ABNORMAL HIGH (ref 11.7–15.4)
WBC: 9.7 10*3/uL (ref 3.4–10.8)

## 2021-10-29 NOTE — Telephone Encounter (Signed)
I called patient to notify her of iron infusion appointment on 11/12/21 at 11 AM at the Patient Rachael Sanchez. Patient verbalized understanding and denies any other questions.  ? ?[name], RN ?10/29/21 ?

## 2021-10-29 NOTE — Addendum Note (Signed)
Addended by: Geanie Berlin on: 10/29/2021 10:27 AM ? ? Modules accepted: Orders ? ?

## 2021-11-03 ENCOUNTER — Encounter: Payer: Self-pay | Admitting: Family Medicine

## 2021-11-03 DIAGNOSIS — D563 Thalassemia minor: Secondary | ICD-10-CM | POA: Insufficient documentation

## 2021-11-12 ENCOUNTER — Non-Acute Institutional Stay (HOSPITAL_COMMUNITY)
Admission: RE | Admit: 2021-11-12 | Discharge: 2021-11-12 | Disposition: A | Discharge status: Home / Self Care | Payer: Medicaid Other | Source: Ambulatory Visit | Attending: Internal Medicine | Admitting: Internal Medicine

## 2021-11-12 ENCOUNTER — Ambulatory Visit: Payer: Medicaid Other | Admitting: *Deleted

## 2021-11-12 ENCOUNTER — Encounter: Payer: Self-pay | Admitting: *Deleted

## 2021-11-12 ENCOUNTER — Ambulatory Visit: Payer: Medicaid Other | Attending: Obstetrics and Gynecology

## 2021-11-12 VITALS — BP 120/60 | HR 100

## 2021-11-12 DIAGNOSIS — Z362 Encounter for other antenatal screening follow-up: Secondary | ICD-10-CM | POA: Insufficient documentation

## 2021-11-12 DIAGNOSIS — Z3A23 23 weeks gestation of pregnancy: Secondary | ICD-10-CM | POA: Diagnosis not present

## 2021-11-12 DIAGNOSIS — D649 Anemia, unspecified: Secondary | ICD-10-CM | POA: Insufficient documentation

## 2021-11-12 DIAGNOSIS — Z3A00 Weeks of gestation of pregnancy not specified: Secondary | ICD-10-CM | POA: Insufficient documentation

## 2021-11-12 DIAGNOSIS — O99012 Anemia complicating pregnancy, second trimester: Secondary | ICD-10-CM | POA: Diagnosis present

## 2021-11-12 DIAGNOSIS — O0932 Supervision of pregnancy with insufficient antenatal care, second trimester: Secondary | ICD-10-CM

## 2021-11-12 DIAGNOSIS — D563 Thalassemia minor: Secondary | ICD-10-CM | POA: Insufficient documentation

## 2021-11-12 DIAGNOSIS — Z363 Encounter for antenatal screening for malformations: Secondary | ICD-10-CM | POA: Insufficient documentation

## 2021-11-12 DIAGNOSIS — O4442 Low lying placenta NOS or without hemorrhage, second trimester: Secondary | ICD-10-CM | POA: Insufficient documentation

## 2021-11-12 DIAGNOSIS — Z3400 Encounter for supervision of normal first pregnancy, unspecified trimester: Secondary | ICD-10-CM | POA: Insufficient documentation

## 2021-11-12 DIAGNOSIS — Z8742 Personal history of other diseases of the female genital tract: Secondary | ICD-10-CM | POA: Insufficient documentation

## 2021-11-12 MED ORDER — SODIUM CHLORIDE 0.9 % IV SOLN: INTRAVENOUS | Status: DC | PRN

## 2021-11-12 MED ORDER — ALBUTEROL SULFATE (2.5 MG/3ML) 0.083% IN NEBU: 2.5000 mg | INHALATION_SOLUTION | Freq: Once | RESPIRATORY_TRACT | Status: DC | PRN

## 2021-11-12 MED ORDER — EPINEPHRINE PF 1 MG/ML IJ SOLN
0.3000 mg | Freq: Once | INTRAMUSCULAR | Status: DC | PRN
  Filled 2021-11-12: qty 1

## 2021-11-12 MED ORDER — METHYLPREDNISOLONE SODIUM SUCC 125 MG IJ SOLR: 125.0000 mg | Freq: Once | INTRAMUSCULAR | Status: DC | PRN

## 2021-11-12 MED ORDER — SODIUM CHLORIDE 0.9 % IV BOLUS: 500.0000 mL | Freq: Once | INTRAVENOUS | Status: DC | PRN

## 2021-11-12 MED ORDER — SODIUM CHLORIDE 0.9 % IV SOLN
300.0000 mg | INTRAVENOUS | Status: DC
  Administered 2021-11-12: 300 mg via INTRAVENOUS
  Filled 2021-11-12: qty 15

## 2021-11-12 MED ORDER — DIPHENHYDRAMINE HCL 50 MG/ML IJ SOLN: 25.0000 mg | Freq: Once | INTRAMUSCULAR | Status: DC | PRN

## 2021-11-12 NOTE — Progress Notes (Signed)
PATIENT CARE CENTER NOTE: ? ?Diagnosis: anemia affecting pregnancy in second trimester  ? ?Provider: Lyndel Safe MD ? ?Procedure: Venofer 300mg  ? ? ?Patient received IV Venofer ( dose #1 of 3).  Tolerated well, no PRN medications required,  vitals stable, discharge instructions given , verbalized understanding. Pt declined to stay for 1 hour observation post infusion. Pt scheduled for next infusion on 11/18/21 , informed she will RTC the following week for her final infusion, verbalized understanding. Patient alert, oriented, and ambulatory at the time of discharge.   ?

## 2021-11-15 ENCOUNTER — Encounter: Payer: Self-pay | Admitting: Radiology

## 2021-11-18 ENCOUNTER — Non-Acute Institutional Stay (HOSPITAL_COMMUNITY)
Admission: RE | Admit: 2021-11-18 | Discharge: 2021-11-18 | Disposition: A | Discharge status: Home / Self Care | Payer: Medicaid Other | Source: Ambulatory Visit | Attending: Internal Medicine | Admitting: Internal Medicine

## 2021-11-18 DIAGNOSIS — O99012 Anemia complicating pregnancy, second trimester: Secondary | ICD-10-CM | POA: Diagnosis not present

## 2021-11-18 MED ORDER — SODIUM CHLORIDE 0.9 % IV SOLN: INTRAVENOUS | Status: DC | PRN

## 2021-11-18 MED ORDER — SODIUM CHLORIDE 0.9 % IV SOLN
300.0000 mg | Freq: Once | INTRAVENOUS | Status: AC
  Administered 2021-11-18: 300 mg via INTRAVENOUS
  Filled 2021-11-18: qty 15

## 2021-11-18 NOTE — Progress Notes (Signed)
PATIENT CARE CENTER NOTE ? ? ?Diagnosis: Anemia affecting pregnancy in second trimester  ? ? ?Provider: Lauretta Chester, MD ? ? ?Procedure: Venofer 300 mg infusion  ? ? ?Note:  Patient received Venofer 300 mg infusion (dose # 2 of 3) via PIV. Tolerated well with no adverse reaction. Patient declined to stay for 1 hour observation post infusion. Vital signs stable. AVS offered but patient refused. Patient to come back next week for final Venofer infusion. Alert, oriented and ambulatory at discharge.   ?

## 2021-11-25 ENCOUNTER — Encounter: Payer: Self-pay | Admitting: Family Medicine

## 2021-11-25 ENCOUNTER — Ambulatory Visit (INDEPENDENT_AMBULATORY_CARE_PROVIDER_SITE_OTHER): Payer: Medicaid Other | Admitting: Family Medicine

## 2021-11-25 VITALS — BP 119/74 | HR 98 | Temp 98.6°F | Wt 155.2 lb

## 2021-11-25 DIAGNOSIS — O4440 Low lying placenta NOS or without hemorrhage, unspecified trimester: Secondary | ICD-10-CM

## 2021-11-25 DIAGNOSIS — O0932 Supervision of pregnancy with insufficient antenatal care, second trimester: Secondary | ICD-10-CM

## 2021-11-25 DIAGNOSIS — N856 Intrauterine synechiae: Secondary | ICD-10-CM

## 2021-11-25 DIAGNOSIS — O99012 Anemia complicating pregnancy, second trimester: Secondary | ICD-10-CM

## 2021-11-25 DIAGNOSIS — Z3400 Encounter for supervision of normal first pregnancy, unspecified trimester: Secondary | ICD-10-CM

## 2021-11-25 DIAGNOSIS — D563 Thalassemia minor: Secondary | ICD-10-CM

## 2021-11-25 NOTE — Progress Notes (Signed)
? ?  Subjective:  ?Rachael Sanchez is a 21 y.o. G1P0 at 38w3dbeing seen today for ongoing prenatal care.  She is currently monitored for the following issues for this low-risk pregnancy and has Supervision of normal first pregnancy, antepartum; Nausea and vomiting in pregnancy; Food insecurity; Prediabetes; Anemia affecting pregnancy; Late prenatal care affecting pregnancy in second trimester; Uterine synechiae; Low-lying placenta; and Alpha thalassemia silent carrier on their problem list. ? ?Patient reports no complaints.  Contractions: Not present. Vag. Bleeding: None.  Movement: Present. Denies leaking of fluid.  ? ?The following portions of the patient's history were reviewed and updated as appropriate: allergies, current medications, past family history, past medical history, past social history, past surgical history and problem list. Problem list updated. ? ?Objective:  ? ?Vitals:  ? 11/25/21 1525  ?BP: 119/74  ?Pulse: 98  ?Temp: 98.6 ?F (37 ?C)  ?Weight: 155 lb 3.2 oz (70.4 kg)  ? ? ?Fetal Status: Fetal Heart Rate (bpm): 148   Movement: Present    ? ?General:  Alert, oriented and cooperative. Patient is in no acute distress.  ?Skin: Skin is warm and dry. No rash noted.   ?Cardiovascular: Normal heart rate noted  ?Respiratory: Normal respiratory effort, no problems with respiration noted  ?Abdomen: Soft, gravid, appropriate for gestational age. Pain/Pressure: Absent     ?Pelvic: Vag. Bleeding: None     ?Cervical exam deferred        ?Extremities: Normal range of motion.     ?Mental Status: Normal mood and affect. Normal behavior. Normal judgment and thought content.  ? ?Urinalysis:     ? ?Assessment and Plan:  ?Pregnancy: G1P0 at 254w3d ?1. Anemia affecting pregnancy in second trimester ?S/p IV iron x2, will see CBC after next visit ? ?2. Late prenatal care affecting pregnancy in second trimester ? ? ?3. Supervision of normal first pregnancy, antepartum ?BP and FHR normal ?Worried about some paperwork  she needs for her job, advised she bring it to front desk ASAP so that we can complete it, advised of ten business day turnaround ?Discussed fasting labs for next visit ? ?4. Alpha thalassemia silent carrier ?Discussed partner testing, given kit today ? ?5. Low-lying placenta ?Resolved on most recent scan, resolved from problem list ? ?6. Uterine synechiae ?None seen on last [place] ?Preterm labor symptoms and general obstetric precautions including but not limited to vaginal bleeding, contractions, leaking of fluid and fetal movement were reviewed in detail with the patient. ?Please refer to After Visit Summary for other counseling recommendations.  ?Return in 2 weeks (on 12/09/2021) for [facility] patient, ob visit. ? ? ?EcClarnce FlockMD ? ?

## 2021-11-25 NOTE — Patient Instructions (Addendum)
Safe Medications in Pregnancy  ? ?Acne:  ?Benzoyl Peroxide  ?Salicylic Acid  ? ?Backache/Headache:  ?Tylenol: 2 regular strength every 4 hours OR  ?             2 Extra strength every 6 hours  ? ?Colds/Coughs/Allergies:  ?Benadryl (alcohol free) 25 mg every 6 hours as needed  ?Breath right strips  ?Claritin  ?Cepacol throat lozenges  ?Chloraseptic throat spray  ?Cold-Eeze- up to three times per day  ?Cough drops, alcohol free  ?Flonase (by prescription only)  ?Guaifenesin  ?Mucinex  ?Robitussin DM (plain only, alcohol free)  ?Saline nasal spray/drops  ?Sudafed (pseudoephedrine) & Actifed * use only after [redacted] weeks gestation and if you do not have high blood pressure  ?Tylenol  ?Vicks Vaporub  ?Zinc lozenges  ?Zyrtec  ? ?Constipation:  ?Colace  ?Ducolax suppositories  ?Fleet enema  ?Glycerin suppositories  ?Metamucil  ?Milk of magnesia  ?Miralax  ?Senokot  ?Smooth move tea  ? ?Diarrhea:  ?Kaopectate  ?Imodium A-D  ? ?*NO pepto Bismol  ? ?Hemorrhoids:  ?Anusol  ?Anusol HC  ?Preparation H  ?Tucks  ? ?Indigestion:  ?Tums  ?Maalox  ?Mylanta  ?Zantac  ?Pepcid  ? ?Insomnia:  ?Benadryl (alcohol free) 25mg every 6 hours as needed  ?Tylenol PM  ?Unisom, no Gelcaps  ? ?Leg Cramps:  ?Tums  ?MagGel  ? ?Nausea/Vomiting:  ?Bonine  ?Dramamine  ?Emetrol  ?Ginger extract  ?Sea bands  ?Meclizine  ?Nausea medication to take during pregnancy:  ?Unisom (doxylamine succinate 25 mg tablets) Take one tablet daily at bedtime. If symptoms are not adequately controlled, the dose can be increased to a maximum recommended dose of two tablets daily (1/2 tablet in the morning, 1/2 tablet mid-afternoon and one at bedtime).  ?Vitamin B6 100mg tablets. Take one tablet twice a day (up to 200 mg per day).  ? ?Skin Rashes:  ?Aveeno products  ?Benadryl cream or 25mg every 6 hours as needed  ?Calamine Lotion  ?1% cortisone cream  ? ?Yeast infection:  ?Gyne-lotrimin 7  ?Monistat 7  ? ? ?**If taking multiple medications, please check labels to avoid  duplicating the same active ingredients  ?**take medication as directed on the label  ?** Do not exceed 4000 mg of tylenol in 24 hours  ?**Do not take medications that contain aspirin or ibuprofen  ?    ?   ?  ?Contraception Choices ?Contraception, also called birth control, refers to methods or devices that prevent pregnancy. ?Hormonal methods ? ?Contraceptive implant ?A contraceptive implant is a thin, plastic tube that contains a hormone that prevents pregnancy. It is different from an intrauterine device (IUD). It is inserted into the upper part of the arm by a health care provider. Implants can be effective for up to 3 years. ?Progestin-only injections ?Progestin-only injections are injections of progestin, a synthetic form of the hormone progesterone. They are given every 3 months by a health care provider. ?Birth control pills ?Birth control pills are pills that contain hormones that prevent pregnancy. They must be taken once a day, preferably at the same time each day. A prescription is needed to use this method of contraception. ?Birth control patch ?The birth control patch contains hormones that prevent pregnancy. It is placed on the skin and must be changed once a week for three weeks and removed on the fourth week. A prescription is needed to use this method of contraception. ?Vaginal ring ?A vaginal ring contains hormones that prevent pregnancy. It is placed in the   vagina for three weeks and removed on the fourth week. After that, the process is repeated with a new ring. A prescription is needed to use this method of contraception. ?Emergency contraceptive ?Emergency contraceptives prevent pregnancy after unprotected sex. They come in pill form and can be taken up to 5 days after sex. They work best the sooner they are taken after having sex. Most emergency contraceptives are available without a prescription. This method should not be used as your only form of birth control. ?Barrier methods ? ?Female  condom ?A female condom is a thin sheath that is worn over the penis during sex. Condoms keep sperm from going inside a woman's body. They can be used with a sperm-killing substance (spermicide) to increase their effectiveness. They should be thrown away after one use. ?Female condom ?A female condom is a soft, loose-fitting sheath that is put into the vagina before sex. The condom keeps sperm from going inside a woman's body. They should be thrown away after one use. ?Diaphragm ?A diaphragm is a soft, dome-shaped barrier. It is inserted into the vagina before sex, along with a spermicide. The diaphragm blocks sperm from entering the uterus, and the spermicide kills sperm. A diaphragm should be left in the vagina for 6-8 hours after sex and removed within 24 hours. ?A diaphragm is prescribed and fitted by a health care provider. A diaphragm should be replaced every 1-2 years, after giving birth, after gaining more than 15 lb (6.8 kg), and after pelvic surgery. ?Cervical cap ?A cervical cap is a round, soft latex or plastic cup that fits over the cervix. It is inserted into the vagina before sex, along with spermicide. It blocks sperm from entering the uterus. The cap should be left in place for 6-8 hours after sex and removed within 48 hours. A cervical cap must be prescribed and fitted by a health care provider. It should be replaced every 2 years. ?Sponge ?A sponge is a soft, circular piece of polyurethane foam with spermicide in it. The sponge helps block sperm from entering the uterus, and the spermicide kills sperm. To use it, you make it wet and then insert it into the vagina. It should be inserted before sex, left in for at least 6 hours after sex, and removed and thrown away within 30 hours. ?Spermicides ?Spermicides are chemicals that kill or block sperm from entering the cervix and uterus. They can come as a cream, jelly, suppository, foam, or tablet. A spermicide should be inserted into the vagina with an  applicator at least 10-15 minutes before sex to allow time for it to work. The process must be repeated every time you have sex. Spermicides do not require a prescription. ?Intrauterine contraception ?Intrauterine device (IUD) ?An IUD is a T-shaped device that is put in a woman's uterus. There are two types: ?Hormone IUD.This type contains progestin, a synthetic form of the hormone progesterone. This type can stay in place for 3-5 years. ?Copper IUD.This type is wrapped in copper wire. It can stay in place for 10 years. ?Permanent methods of contraception ?Female tubal ligation ?In this method, a woman's fallopian tubes are sealed, tied, or blocked during surgery to prevent eggs from traveling to the uterus. ?Hysteroscopic sterilization ?In this method, a small, flexible insert is placed into each fallopian tube. The inserts cause scar tissue to form in the fallopian tubes and block them, so sperm cannot reach an egg. The procedure takes about 3 months to be effective. Another form of birth   control must be used during those 3 months. ?Female sterilization ?This is a procedure to tie off the tubes that carry sperm (vasectomy). After the procedure, the man can still ejaculate fluid (semen). Another form of birth control must be used for 3 months after the procedure. ?Natural planning methods ?Natural family planning ?In this method, a couple does not have sex on days when the woman could become pregnant. ?Calendar method ?In this method, the woman keeps track of the length of each menstrual cycle, identifies the days when pregnancy can happen, and does not have sex on those days. ?Ovulation method ?In this method, a couple avoids sex during ovulation. ?Symptothermal method ?This method involves not having sex during ovulation. The woman typically checks for ovulation by watching changes in her temperature and in the consistency of cervical mucus. ?Post-ovulation method ?In this method, a couple waits to have sex until  after ovulation. ?Where to find more information ?Centers for Disease Control and Prevention: www.cdc.gov ?Summary ?Contraception, also called birth control, refers to methods or devices that prevent pregnancy. ?H

## 2021-11-27 ENCOUNTER — Non-Acute Institutional Stay (HOSPITAL_COMMUNITY)
Admission: RE | Admit: 2021-11-27 | Discharge: 2021-11-27 | Disposition: A | Discharge status: Home / Self Care | Payer: Medicaid Other | Source: Ambulatory Visit | Attending: Internal Medicine | Admitting: Internal Medicine

## 2021-11-27 DIAGNOSIS — O99012 Anemia complicating pregnancy, second trimester: Secondary | ICD-10-CM | POA: Diagnosis present

## 2021-11-27 DIAGNOSIS — D649 Anemia, unspecified: Secondary | ICD-10-CM | POA: Insufficient documentation

## 2021-11-27 MED ORDER — SODIUM CHLORIDE 0.9 % IV SOLN: INTRAVENOUS | Status: DC | PRN

## 2021-11-27 MED ORDER — SODIUM CHLORIDE 0.9 % IV SOLN
300.0000 mg | Freq: Once | INTRAVENOUS | Status: AC
  Administered 2021-11-27: 300 mg via INTRAVENOUS
  Filled 2021-11-27: qty 15

## 2021-11-27 NOTE — Progress Notes (Signed)
PATIENT CARE CENTER NOTE ?  ?  ?Diagnosis: Anemia affecting pregnancy in second trimester  ?  ?  ?Provider: Lyndel Safe, MD ?  ?  ?Procedure: Venofer 300 mg infusion  ?  ?  ?Note:  Patient received Venofer 300 mg infusion (dose # 3 of 3) via PIV. Tolerated well with no adverse reaction. Patient declined to stay for 1 hour observation post infusion. Vital signs stable. AVS offered but patient refused. Patient has now completed all ordered infusions. Patient alert, oriented and ambulatory at discharge.  ?

## 2021-12-17 ENCOUNTER — Other Ambulatory Visit: Payer: Self-pay

## 2021-12-17 ENCOUNTER — Ambulatory Visit (INDEPENDENT_AMBULATORY_CARE_PROVIDER_SITE_OTHER): Payer: Medicaid Other | Admitting: Family Medicine

## 2021-12-17 ENCOUNTER — Other Ambulatory Visit: Payer: Medicaid Other

## 2021-12-17 ENCOUNTER — Encounter: Payer: Self-pay | Admitting: Family Medicine

## 2021-12-17 VITALS — BP 110/63 | HR 97 | Wt 163.1 lb

## 2021-12-17 DIAGNOSIS — O99013 Anemia complicating pregnancy, third trimester: Secondary | ICD-10-CM

## 2021-12-17 DIAGNOSIS — Z3400 Encounter for supervision of normal first pregnancy, unspecified trimester: Secondary | ICD-10-CM

## 2021-12-17 DIAGNOSIS — O0932 Supervision of pregnancy with insufficient antenatal care, second trimester: Secondary | ICD-10-CM

## 2021-12-17 DIAGNOSIS — D563 Thalassemia minor: Secondary | ICD-10-CM

## 2021-12-17 DIAGNOSIS — Z23 Encounter for immunization: Secondary | ICD-10-CM

## 2021-12-17 DIAGNOSIS — F419 Anxiety disorder, unspecified: Secondary | ICD-10-CM

## 2021-12-17 DIAGNOSIS — N856 Intrauterine synechiae: Secondary | ICD-10-CM

## 2021-12-17 DIAGNOSIS — R8271 Bacteriuria: Secondary | ICD-10-CM | POA: Insufficient documentation

## 2021-12-17 NOTE — Addendum Note (Signed)
Addended by: Georgia Lopes on: 12/17/2021 09:43 AM   Modules accepted: Orders

## 2021-12-17 NOTE — Patient Instructions (Signed)

## 2021-12-17 NOTE — Progress Notes (Signed)
   Subjective:  Rachael Sanchez is a 21 y.o. G1P0 at [redacted]w[redacted]d being seen today for ongoing prenatal care.  She is currently monitored for the following issues for this low-risk pregnancy and has Supervision of normal first pregnancy, antepartum; Nausea and vomiting in pregnancy; Food insecurity; Prediabetes; Anemia affecting pregnancy; Late prenatal care affecting pregnancy in second trimester; Uterine synechiae; Alpha thalassemia silent carrier; and GBS bacteriuria on their problem list.  Patient reports no complaints.  Contractions: Not present. Vag. Bleeding: None.  Movement: Present. Denies leaking of fluid.   The following portions of the patient's history were reviewed and updated as appropriate: allergies, current medications, past family history, past medical history, past social history, past surgical history and problem list. Problem list updated.  Objective:   Vitals:   12/17/21 0827  BP: 110/63  Pulse: 97  Weight: 163 lb 1.6 oz (74 kg)    Fetal Status: Fetal Heart Rate (bpm): 141   Movement: Present     General:  Alert, oriented and cooperative. Patient is in no acute distress.  Skin: Skin is warm and dry. No rash noted.   Cardiovascular: Normal heart rate noted  Respiratory: Normal respiratory effort, no problems with respiration noted  Abdomen: Soft, gravid, appropriate for gestational age. Pain/Pressure: Absent     Pelvic: Vag. Bleeding: None     Cervical exam deferred        Extremities: Normal range of motion.     Mental Status: Normal mood and affect. Normal behavior. Normal judgment and thought content.   Urinalysis:      Assessment and Plan:  Pregnancy: G1P0 at [redacted]w[redacted]d  1. Supervision of normal first pregnancy, antepartum BP and FHR normal Third trimester labs and TDAP today  2. GBS bacteriuria Ppx in labor  3. Uterine synechiae None seen on last [place]  4. Anemia affecting pregnancy in third trimester Follow up CBC today  5. Alpha thalassemia silent  carrier Given partner testing kit at last visit, still has not sent it but plans to do so  6. Anxiety Tearful and endorsing feeling low though declined to discuss specifics Denies any SI Accepts referral to [facility]  Preterm labor symptoms and general obstetric precautions including but not limited to vaginal bleeding, contractions, leaking of fluid and fetal movement were reviewed in detail with the patient. Please refer to After Visit Summary for other counseling recommendations.  Return in 2 weeks (on 12/31/2021) for [facility] patient, ob visit.   Clarnce Flock, MD

## 2021-12-18 LAB — RPR: RPR Ser Ql: NONREACTIVE

## 2021-12-18 LAB — HIV ANTIBODY (ROUTINE TESTING W REFLEX): HIV Screen 4th Generation wRfx: NONREACTIVE

## 2021-12-18 LAB — CBC
Hematocrit: 31.8 % — ABNORMAL LOW (ref 34.0–46.6)
Hemoglobin: 9.7 g/dL — ABNORMAL LOW (ref 11.1–15.9)
MCH: 21.4 pg — ABNORMAL LOW (ref 26.6–33.0)
MCHC: 30.5 g/dL — ABNORMAL LOW (ref 31.5–35.7)
MCV: 70 fL — ABNORMAL LOW (ref 79–97)
Platelets: 175 10*3/uL (ref 150–450)
RBC: 4.54 x10E6/uL (ref 3.77–5.28)
RDW: 20.6 % — ABNORMAL HIGH (ref 11.7–15.4)
WBC: 8.5 10*3/uL (ref 3.4–10.8)

## 2021-12-18 LAB — GLUCOSE TOLERANCE, 2 HOURS W/ 1HR
Glucose, 1 hour: 112 mg/dL (ref 70–179)
Glucose, 2 hour: 91 mg/dL (ref 70–152)
Glucose, Fasting: 77 mg/dL (ref 70–91)

## 2021-12-23 NOTE — BH Specialist Note (Signed)
Integrated Behavioral Health via Telemedicine Visit  01/06/2022 Alonnah Woodrow [id number]  Number of [place] Clinician visits: 1- Initial Visit  Session Start time: [id number]   Session End time: [id number]  Total time in minutes: 33   Referring Provider: Clayton Lefort, MD Patient/Family location: Home [facility] Provider location: [facility]  All persons participating in visit: Patient Rachael Sanchez and [place]   Types of Service: Individual psychotherapy and Video visit  I connected with Rachael Sanchez and/or [facility]  via  Telephone or Video Enabled Telemedicine Application  (Video is Caregility application) and verified that I am speaking with the correct person using two identifiers. Discussed confidentiality: Yes   I discussed the limitations of telemedicine and the availability of in person appointments.  Discussed there is a possibility of technology failure and discussed alternative modes of communication if that failure occurs.  I discussed that engaging in this telemedicine visit, they consent to the provision of behavioral healthcare and the services will be billed under their insurance.  Patient and/or legal guardian expressed understanding and consented to Telemedicine visit: Yes   Presenting Concerns: Patient and/or family reports the following symptoms/concerns: Conflict with FOB; pt goals are to move into her own housing with FOB and prepare for baby. Pt's family very supportive.  Duration of problem: Current pregnancy; Severity of problem: moderate  Patient and/or Family's Strengths/Protective Factors: Social connections, Concrete supports in place (healthy food, safe environments, etc.), and Sense of purpose  Goals Addressed: Patient will:  Reduce symptoms of: stress   Demonstrate ability to: Increase healthy adjustment to current life  circumstances  Progress towards Goals: Ongoing  Interventions: Interventions utilized:  Link to [employer] and Supportive Reflection Standardized Assessments completed: Not Needed  Patient and/or Family Response: Patient agrees with treatment plan.   Assessment: Patient currently experiencing Psychosocial stress and Other specified counseling (perinatal mood check).   Patient may benefit from psychoeducation and brief therapeutic interventions regarding coping with symptoms of current life stress .  Plan: Follow up with behavioral health clinician on : Two weeks postpartum Behavioral recommendations:  -Continue taking prenatal vitamin daily as prescribed -Continue plan to move into own home prior to baby's arrival -Continue prioritizing healthy self-care for remainder of pregnancy (adequate sleep and routine meals) Referral(s): [place] (In Clinic)  I discussed the assessment and treatment plan with the patient and/or parent/guardian. They were provided an opportunity to ask questions and all were answered. They agreed with the plan and demonstrated an understanding of the instructions.   They were advised to call back or seek an in-person evaluation if the symptoms worsen or if the condition fails to improve as anticipated.  Garlan Fair, LCSW     01/01/2022   10:32 AM 12/17/2021    8:35 AM 11/25/2021    3:26 PM 10/27/2021    9:26 AM 09/29/2021   12:04 PM  Depression screen PHQ 2/9  Decreased Interest 0 1 0 0 0  Down, Depressed, Hopeless 0 1 0 0 1  PHQ - 2 Score 0 2 0 0 1  Altered sleeping 1 1 1 2 2   Tired, decreased energy 1 1 1 1 2   Change in appetite 1 1 1 1 1   Feeling bad or failure about yourself  0 0 0 0 0  Trouble concentrating 0 1 1 1 1   Moving slowly or fidgety/restless 0 0 0 0 0  Suicidal thoughts 0  0 0 0 0  PHQ-9 Score 3 6 4 5 7   Difficult doing work/chores  Not difficult at all Not difficult at all  Not difficult at  all      01/01/2022   10:32 AM 12/17/2021    8:35 AM 11/25/2021    3:26 PM 10/27/2021    9:26 AM  GAD 7 : Generalized Anxiety Score  Nervous, Anxious, on Edge 0 1 0 1  Control/stop worrying 1 1 1 1   Worry too much - different things 1 1 1 1   Trouble relaxing 0 1 1 1   Restless 0 0 0 1  Easily annoyed or irritable 2 1 1 1   Afraid - awful might happen 1 0 0 1  Total GAD 7 Score 5 5 4 7   Anxiety Difficulty   Not difficult at all

## 2021-12-26 ENCOUNTER — Telehealth: Payer: Self-pay

## 2021-12-26 NOTE — Telephone Encounter (Signed)
I called patient due to babyscripts alert seen below. Per patent when she checked her blood pressure this morning she had not eaten or drank anything yet and had been up and moving around. Patient states since then she has eaten and had fluids. Patient denies any headache, blurred vision, dizziness, nausea or shortness of breath at this time. Patient rechecked her blood pressure while on the phone and it was 121/79. I reviewed signs and symptoms of pre-eclampsia with patient. I also encouraged patient to continue checking her blood pressure and charting it in the babyscripts app. Patient verbalized understanding and denies any other questions.    Alesia Richards, RN 12/26/21     Babyscripts Trigger Notification for [place], [facility]  Trigger Severity: Elevated  Blood Pressure Value: 141/85  Reading Date: 2021-12-26  Symptoms: Nausea, Shortness Of Breath

## 2022-01-01 ENCOUNTER — Ambulatory Visit (INDEPENDENT_AMBULATORY_CARE_PROVIDER_SITE_OTHER): Payer: Medicaid Other | Admitting: Family Medicine

## 2022-01-01 VITALS — BP 109/75 | HR 97 | Wt 165.6 lb

## 2022-01-01 DIAGNOSIS — D563 Thalassemia minor: Secondary | ICD-10-CM

## 2022-01-01 DIAGNOSIS — R7303 Prediabetes: Secondary | ICD-10-CM

## 2022-01-01 DIAGNOSIS — R8271 Bacteriuria: Secondary | ICD-10-CM

## 2022-01-01 DIAGNOSIS — N856 Intrauterine synechiae: Secondary | ICD-10-CM

## 2022-01-01 DIAGNOSIS — O0932 Supervision of pregnancy with insufficient antenatal care, second trimester: Secondary | ICD-10-CM

## 2022-01-01 DIAGNOSIS — Z3400 Encounter for supervision of normal first pregnancy, unspecified trimester: Secondary | ICD-10-CM

## 2022-01-01 DIAGNOSIS — O219 Vomiting of pregnancy, unspecified: Secondary | ICD-10-CM

## 2022-01-01 DIAGNOSIS — O99013 Anemia complicating pregnancy, third trimester: Secondary | ICD-10-CM

## 2022-01-01 NOTE — Progress Notes (Addendum)
   PRENATAL VISIT NOTE  Subjective:  Rachael Sanchez is a 21 y.o. G1P0 at [redacted]w[redacted]d being seen today for ongoing prenatal care.  She is currently monitored for the following issues for this low-risk pregnancy and has Supervision of normal first pregnancy, antepartum; Nausea and vomiting in pregnancy; Food insecurity; Prediabetes; Anemia affecting pregnancy; Late prenatal care affecting pregnancy in second trimester; Uterine synechiae; Alpha thalassemia silent carrier; and GBS bacteriuria on their problem list.  Patient reports no complaints.  Contractions: Not present. Vag. Bleeding: None.  Movement: Present. Denies leaking of fluid.   The following portions of the patient's history were reviewed and updated as appropriate: allergies, current medications, past family history, past medical history, past social history, past surgical history and problem list.   Objective:   Vitals:   01/01/22 0939  BP: 109/75  Pulse: 97  Weight: 165 lb 9.6 oz (75.1 kg)    Fetal Status: Fetal Heart Rate (bpm): 149 Fundal Height: 31 cm Movement: Present     General:  Alert, oriented and cooperative. Patient is in no acute distress.  Skin: Skin is warm and dry. No rash noted.   Cardiovascular: Normal heart rate noted  Respiratory: Normal respiratory effort, no problems with respiration noted  Abdomen: Soft, gravid, appropriate for gestational age.  Pain/Pressure: Absent     Pelvic: Cervical exam deferred        Extremities: Normal range of motion.     Mental Status: Normal mood and affect. Normal behavior. Normal judgment and thought content.   Assessment and Plan:  Pregnancy: G1P0 at [redacted]w[redacted]d 1. Anemia affecting pregnancy in third trimester CBC today S/p Venofer x3  2. Late prenatal care affecting pregnancy in second trimester Up to date  3. Supervision of normal first pregnancy, antepartum Up to date Needs a care seat and talked to navigator today-- recommended reaching out to her insurance.   Having trouble sleeping, counseled about support pillows and safe use of benadryl  4. Alpha thalassemia silent carrier monitor  5. Prediabetes Early HA1C 5.7%  6. GBS bacteriuria PCN in labor  7. Uterine synechiae RESOLVED 4-26  8. Nausea and vomiting in pregnancy Not present  Preterm labor symptoms and general obstetric precautions including but not limited to vaginal bleeding, contractions, leaking of fluid and fetal movement were reviewed in detail with the patient. Please refer to After Visit Summary for other counseling recommendations.   Return in about 2 weeks (around 01/15/2022) for Mom+Baby Combined Care.  Future Appointments  Date Time Provider Department Center  01/06/2022 10:15 AM [facility] HE[facility] CLINICIAN [facility]  01/14/2022 10:35 AM Venora Maples, MD [facility]  01/28/2022  2:55 PM Bernerd Limbo, CNM [facility]  02/11/2022 10:35 AM Venora Maples, MD [facility]    Federico Flake, MD

## 2022-01-02 LAB — CBC
Hematocrit: 31.4 % — ABNORMAL LOW (ref 34.0–46.6)
Hemoglobin: 9.9 g/dL — ABNORMAL LOW (ref 11.1–15.9)
MCH: 21.9 pg — ABNORMAL LOW (ref 26.6–33.0)
MCHC: 31.5 g/dL (ref 31.5–35.7)
MCV: 69 fL — ABNORMAL LOW (ref 79–97)
Platelets: 182 10*3/uL (ref 150–450)
RBC: 4.53 x10E6/uL (ref 3.77–5.28)
RDW: 19.7 % — ABNORMAL HIGH (ref 11.7–15.4)
WBC: 9 10*3/uL (ref 3.4–10.8)

## 2022-01-03 ENCOUNTER — Encounter: Payer: Self-pay | Admitting: Family Medicine

## 2022-01-06 ENCOUNTER — Ambulatory Visit (INDEPENDENT_AMBULATORY_CARE_PROVIDER_SITE_OTHER): Payer: Medicaid Other | Admitting: Clinical

## 2022-01-06 DIAGNOSIS — Z7189 Other specified counseling: Secondary | ICD-10-CM | POA: Diagnosis not present

## 2022-01-06 DIAGNOSIS — Z658 Other specified problems related to psychosocial circumstances: Secondary | ICD-10-CM

## 2022-01-06 NOTE — Patient Instructions (Signed)
[facility] [address] [place], [place] [address] [phone] (main office) [phone] (Ranya Fiddler's office)  [email or web address]       BRAINSTORMING  Develop a Plan Goals: Provide a way to start conversation about your new life with a baby Assist parents in recognizing and using resources within their reach Help pave the way before birth for an easier period of transition afterwards.  Make a list of the following information to keep in a central location: Full name of Mom and Partner: _____________________________________________ Baby's full name and Date of Birth: ___________________________________________ Home Address: ___________________________________________________________ ________________________________________________________________________ Home Phone: ____________________________________________________________ Parents' cell numbers: _____________________________________________________ ________________________________________________________________________ Name and contact info for OB: ______________________________________________ Name and contact info for Pediatrician:________________________________________ Contact info for Lactation Consultants: ________________________________________  REST and SLEEP *You each need at least 4-5 hours of uninterrupted sleep every day. Write specific names and contact information.* How are you going to rest in the postpartum period? While partner's home? When partner returns to work? When you both return to work? Where will your baby sleep? Who is available to help during the day? Evening? Night? Who could move in for a period to help support you? What are some ideas to help you get enough  sleep? __________________________________________________________________________________________________________________________________________________________________________________________________________________________________________ NUTRITIOUS FOOD AND DRINK *Plan for meals before your baby is born so you can have healthy food to eat during the immediate postpartum period.* Who will look after breakfast? Lunch? Dinner? List names and contact information. Brainstorm quick, healthy ideas for each meal. What can you do before baby is born to prepare meals for the postpartum period? How can others help you with meals? Which grocery stores provide online shopping and delivery? Which restaurants offer take-out or delivery options? ______________________________________________________________________________________________________________________________________________________________________________________________________________________________________________________________________________________________________________________________________________________________________________________________________  CARE FOR MOM *It's important that mom is cared for and pampered in the postpartum period. Remember, the most important ways new mothers need care are: sleep, nutrition, gentle exercise, and time off.* Who can come take care of mom during this period? Make a list of people with their contact information. List some activities that make you feel cared for, rested, and energized? Who can make sure you have opportunities to do these things? Does mom have a space of her very own within your home that's just for her? Make a "Mama Cave" where she can be comfortable, rest, and renew herself  daily. ______________________________________________________________________________________________________________________________________________________________________________________________________________________________________________________________________________________________________________________________________________________________________________________________________    CARE FOR AND FEEDING BABY *Knowledgeable and encouraging people will offer the best support with regard to feeding your baby.* Educate yourself and choose the best feeding option for your baby. Make a list of people who will guide, support, and be a resource for you as your care for and feed your baby. (Friends that have breastfed or are currently breastfeeding, lactation consultants, breastfeeding support groups, etc.) Consider a postpartum doula. (These websites can give you information: dona.org & padanc.org) Seek out local breastfeeding resources like the breastfeeding support group at [facility] or [employer]. ______________________________________________________________________________________________________________________________________________________________________________________________________________________________________________________________________________________________________________________________________________________________________________________________________  CHORES AND ERRANDS Who can help with a thorough cleaning before baby is born? Make a list of people who will help with housekeeping and chores, like laundry, light cleaning, dishes, bathrooms, etc. Who can run some errands for you? What can you do to make sure you are stocked with basic supplies before baby is born? Who is going to do the  shopping? ______________________________________________________________________________________________________________________________________________________________________________________________________________________________________________________________________________________________________________________________________________________________________________________________________     Family Adjustment *Nurture yourselves.it helps parents be more loving and allows for better bonding with their child.* What sorts of things do   doing together? Which activities help you to connect and strengthen your relationship? Make a list of those things. Make a list of people whom you trust to care for your baby so you can have some time together as a couple. What types of things help partner feel connected to Mom? Make a list. What needs will partner have in order to bond with baby? Other children? Who will care for them when you go into labor and while you are in the hospital? Think about what the needs of your older children might be. Who can help you meet those needs? In what ways are you helping them prepare for bringing baby home? List some specific strategies you have for family adjustment. _______________________________________________________________________________________________________________________________________________________________________________________________________________________________________________________________________________________________________________________________________________  SUPPORT *Someone who can empathize with experiences normalizes your problems and makes them more bearable.* Make a list of other friends, neighbors, and/or co-workers you know with infants (and small children, if applicable) with whom you can connect. Make a list of local or online support groups, mom groups, etc. in which you can be  involved. ______________________________________________________________________________________________________________________________________________________________________________________________________________________________________________________________________________________________________________________________________________________________________________________________________  Childcare Plans Investigate and plan for childcare if mom is returning to work. Talk about mom's concerns about her transition back to work. Talk about partner's concerns regarding this transition.  Mental Health *Your mental health is one of the highest priorities for a pregnant or postpartum mom.* 1 in 5 women experience anxiety and/or depression from the time of conception through the first year after birth. Postpartum Mood Disorders are the #1 complication of pregnancy and childbirth and the suffering experienced by these mothers is not necessary! These illnesses are temporary and respond well to treatment, which often includes self-care, social support, talk therapy, and medication when needed. Women experiencing anxiety and depression often say things like: "I'm supposed to be happy.why do I feel so sad?", "Why can't I snap out of it?", "I'm having thoughts that scare me." There is no need to be embarrassed if you are feeling these symptoms: Overwhelmed, anxious, angry, sad, guilty, irritable, hopeless, exhausted but can't sleep You are NOT alone. You are NOT to blame. With help, you WILL be well. Where can I find help? Medical professionals such as your OB, midwife, gynecologist, family practitioner, primary care provider, pediatrician, or mental health providers; [facility] support groups: Feelings After Birth, Breastfeeding Support Group, Baby and Me Group, and Fit 4 Two exercise classes. You have permission to ask for help. It will confirm your feelings, validate your experiences,  share/learn coping strategies, and gain support and encouragement as you heal. You are important! BRAINSTORM Make a list of local resources, including resources for mom and for partner. Identify support groups. Identify people to call late at night - include names and contact info. Talk with partner about perinatal mood and anxiety disorders. Talk with your OB, midwife, and doula about baby blues and about perinatal mood and anxiety disorders. Talk with your pediatrician about perinatal mood and anxiety disorders.   Support & Sanity Savers   What do you really need?  Basics In preparing for a new baby, many expectant parents spend hours shopping for baby clothes, decorating the nursery, and deciding which car seat to buy. Yet most don't think much about what the reality of parenting a newborn will be like, and what they need to make it through that. So, here is the advice of experienced parents. We know you'll read this, and think "they're exaggerating, I don't really need that." Just trust us on these, OK? Plan for all of   this, and if it turns out you don't need it, come back and teach us how you did it!  Must-Haves (Once baby's survival needs are met, make sure you attend to your own survival needs!) Sleep An average newborn sleeps 16-18 hours per day, over 6-7 sleep periods, rarely more than three hours at a time. It is normal and healthy for a newborn to wake throughout the night... but really hard on parents!! Naps. Prioritize sleep above any responsibilities like: cleaning house, visiting friends, running errands, etc.  Sleep whenever baby sleeps. If you can't nap, at least have restful times when baby eats. The more rest you get, the more patient you will be, the more emotionally stable, and better at solving problems.  Food You may not have realized it would be difficult to eat when you have a newborn. Yet, when we talk to countless new parents, they say things like "it may be 2:00 pm  when I realize I haven't had breakfast yet." Or "every time we sit down to dinner, baby needs to eat, and my food gets cold, so I don't bother to eat it." Finger food. Before your baby is born, stock up with one months' worth of food that: 1) you can eat with one hand while holding a baby, 2) doesn't need to be prepped, 3) is good hot or cold, 4) doesn't spoil when left out for a few hours, and 5) you like to eat. Think about: nuts, dried fruit, Clif bars, pretzels, jerky, gogurt, baby carrots, apples, bananas, crackers, cheez-n-crackers, string cheese, hot pockets or frozen burritos to microwave, garden burgers and breakfast pastries to put in the toaster, yogurt drinks, etc. Restaurant Menus. Make lists of your favorite restaurants & menu items. When family/friends want to help, you can give specific information without much thought. They can either bring you the food or send gift cards for just the right meals. Freezer Meals.  Take some time to make a few meals to put in the freezer ahead of time.  Easy to freeze meals can be anything such as soup, lasagna, chicken pie, or spaghetti sauce. Set up a Meal Schedule.  Ask friends and family to sign up to bring you meals during the first few weeks of being home. (It can be passed around at baby showers!) You have no idea how helpful this will be until you are in the throes of parenting.  www.takethemameal.com is a great website to check out. Emotional Support Know who to call when you're stressed out. Parenting a newborn is very challenging work. There are times when it totally overwhelms your normal coping abilities. EVERY NEW PARENT NEEDS TO HAVE A PLAN FOR WHO TO CALL WHEN THEY JUST CAN'T COPE ANY MORE. (And it has to be someone other than the baby's other parent!) Before your baby is born, come up with at least one person you can call for support - write their phone number down and post it on the refrigerator. Anxiety & Sadness. Baby blues are normal after  pregnancy; however, there are more severe types of anxiety & sadness which can occur and should not be ignored.  They are always treatable, but you have to take the first step by reaching out for help. [facility] offers a "Mom Talk" group which meets every Tuesday from 10 am - 11 am.  This group is for new moms who need support and connection after their babies are born.  Call [phone].  Really, Really Helpful (Plan for them!   Make sure these happen often!!) Physical Support with Taking Care of Yourselves Asking friends and family. Before your baby is born, set up a schedule of people who can come and visit and help out (or ask a friend to schedule for you). Any time someone says "let me know what I can do to help," sign them up for a day. When they get there, their job is not to take care of the baby (that's your job and your joy). Their job is to take care of you!  Postpartum doulas. If you don't have anyone you can call on for support, look into postpartum doulas:  professionals at helping parents with caring for baby, caring for themselves, getting breastfeeding started, and helping with household tasks. www.padanc.org is a helpful website for learning about doulas in our area. Peer Support / Parent Groups Why: One of the greatest ideas for new parents is to be around other new parents. Parent groups give you a chance to share and listen to others who are going through the same season of life, get a sense of what is normal infant development by watching several babies learn and grow, share your stories of triumph and struggles with empathetic ears, and forgive your own mistakes when you realize all parents are learning by trial and error. Where to find: There are many places you can meet other new parents throughout our community.  [facility] offers the following classes for new moms and their little ones:  Baby and Me (Birth to Crawling) and Breastfeeding Support Group. Go to  www.[email or web address] or call [phone] for more information. Time for your Relationship It's easy to get so caught up in meeting baby's immediate needs that it's hard to find time to connect with your partner, and meet the needs of your relationship. It's also easy to forget what "quality time with your partner" actually looks like. If you take your baby on a date, you'd be amazed how much of your couple time is spent feeding the baby, diapering the baby, admiring the baby, and talking about the baby. Dating: Try to take time for just the two of you. Babysitter tip: Sometimes when moms are breastfeeding a newborn, they find it hard to figure out how to schedule outings around baby's unpredictable feeding schedules. Have the babysitter come for a three hour period. When she comes over, if baby has just eaten, you can leave right away, and come back in two hours. If baby hasn't fed recently, you start the date at home. Once baby gets hungry and gets a good feeding in, you can head out for the rest of your date time. Date Nights at Home: If you can't get out, at least set aside one evening a week to prioritize your relationship: whenever baby dozes off or doesn't have any immediate needs, spend a little time focusing on each other. Potential conflicts: The main relationship conflicts that come up for new parents are: issues related to sexuality, financial stresses, a feeling of an unfair division of household tasks, and conflicts in parenting styles. The more you can work on these issues before baby arrives, the better!  Fun and Frills (Don't forget these. and don't feel guilty for indulging in them!) Everyone has something in life that is a fun little treat that they do just for themselves. It may be: reading the morning paper, or going for a daily jog, or having coffee with a friend once a week, or going to a movie on Friday nights,   or fine chocolates, or bubble baths, or curling up with a good  book. Unless you do fun things for yourself every now and then, it's hard to have the energy for fun with your baby. Whatever your "special" treats are, make sure you find a way to continue to indulge in them after your baby is born. These special moments can recharge you, and allow you to return to baby with a new joy   PERINATAL MOOD DISORDERS: MATERNAL MENTAL HEALTH FROM CONCEPTION THROUGH THE POSTPARTUM PERIOD   _________________________________________Emergency and Crisis Resources If you are an imminent risk to self or others, are experiencing intense personal distress, and/or have noticed significant changes in activities of daily living, call:  911 [facility]: [phone]  [address], [place], [place], [address] Mobile Crisis: 877-626-1772 National Suicide Hotline: 988 Or visit the following crisis centers: Local Emergency Departments [facility]: [address], [place]  [phone]. Hours: 8:30AM-5PM. Insurance Accepted: Medicaid, Medicare, and Uninsured.  [facility]:  [address], [place]  Mon-Friday 8am-3pm, [phone]                                                                                  ___________ Non-Crisis Resources To identify specific providers that are covered by your insurance, contact your insurance company or local agencies:  [facility]: 1-800-256-2452 [facility]: 888-581-9988 [facility]: 1-800-939-5911 Postpartum Support International- [place]-line: 1-800-944-4773                                                      __Outpatient Therapy and Medication Management   Providers:  [facility]: [phone] Hours: 9AM-5PM  Insurance Accepted: AARP, Aetna, BCBS, Cigna, [employer], Humana, Medicare  [facility] [phone] Hours: 8AM-5:30PM  nsurance Accepted: All insurances EXCEPT AARP, Aetna,  [employer], and Humana [facility]: [phone] Hours: 8AM-8PM Insurance Accepted: Aetna, BCBS, Cigna, [employer], Medicaid, Medicare, Uninsured [facility]: [phone] [facility]: [phone] Hours: 8:30AM-7PM Insurance Accepted: Aetna, BCBS, Medicaid, Medicare, Tricare, United Healthcare [facility]:  [phone]   Hours:9AM-5PM Insurance Accepted:  Aetna, BCBS, [facility], Medicaid, United Health Care  [facility]: [phone] Hours: 9AM-5:30PM Insurance Accepted: AARP, Aetna, BCBS, Cigna, and Medicaid, Medicare, United Health Care [facility]:  [phone] Hours: 9am-5pm Insurance Accepted: BCBS; they do not accept Medicaid/Medicare [facility]: [phone] Hours: 9am-9pm Insurance Accepted: All major insurance including Medicaid and Medicare [facility]: [phone] Hours: 9AM- 5PM Insurance Accepted: All insurances EXCEPT Medicaid and Medicare. [facility]: [phone]   ____________                                                                       Parenting Support Groups [facility]: [phone] [facility]:  [phone] Family Support Network: (support for children in the NICU and/or with special needs), [phone]   ___________                                                                 Mental Health Support Groups Mental Health Association: [phone]    _____________                                                                                  Online Resources Postpartum Support International: http://www.postpartum.net/  800-944-4PPD 2Moms Supporting Moms:  www.momssupportingmoms.net    

## 2022-01-14 ENCOUNTER — Inpatient Hospital Stay (HOSPITAL_COMMUNITY)
Admission: AD | Admit: 2022-01-14 | Discharge: 2022-01-14 | Disposition: A | Discharge status: Home / Self Care | Payer: Medicaid Other | Attending: Family Medicine | Admitting: Family Medicine

## 2022-01-14 ENCOUNTER — Encounter (HOSPITAL_COMMUNITY): Payer: Self-pay | Admitting: Family Medicine

## 2022-01-14 ENCOUNTER — Ambulatory Visit (INDEPENDENT_AMBULATORY_CARE_PROVIDER_SITE_OTHER): Payer: Medicaid Other | Admitting: Family Medicine

## 2022-01-14 ENCOUNTER — Encounter: Payer: Self-pay | Admitting: Family Medicine

## 2022-01-14 ENCOUNTER — Other Ambulatory Visit: Payer: Self-pay

## 2022-01-14 VITALS — BP 116/72 | HR 104 | Wt 174.8 lb

## 2022-01-14 DIAGNOSIS — B9689 Other specified bacterial agents as the cause of diseases classified elsewhere: Secondary | ICD-10-CM | POA: Diagnosis not present

## 2022-01-14 DIAGNOSIS — D563 Thalassemia minor: Secondary | ICD-10-CM

## 2022-01-14 DIAGNOSIS — O23593 Infection of other part of genital tract in pregnancy, third trimester: Secondary | ICD-10-CM | POA: Insufficient documentation

## 2022-01-14 DIAGNOSIS — R8271 Bacteriuria: Secondary | ICD-10-CM

## 2022-01-14 DIAGNOSIS — O26893 Other specified pregnancy related conditions, third trimester: Secondary | ICD-10-CM | POA: Insufficient documentation

## 2022-01-14 DIAGNOSIS — N856 Intrauterine synechiae: Secondary | ICD-10-CM

## 2022-01-14 DIAGNOSIS — R1012 Left upper quadrant pain: Secondary | ICD-10-CM | POA: Diagnosis not present

## 2022-01-14 DIAGNOSIS — O26853 Spotting complicating pregnancy, third trimester: Secondary | ICD-10-CM | POA: Diagnosis not present

## 2022-01-14 DIAGNOSIS — N760 Acute vaginitis: Secondary | ICD-10-CM

## 2022-01-14 DIAGNOSIS — Z3A32 32 weeks gestation of pregnancy: Secondary | ICD-10-CM

## 2022-01-14 DIAGNOSIS — O99013 Anemia complicating pregnancy, third trimester: Secondary | ICD-10-CM

## 2022-01-14 DIAGNOSIS — O368130 Decreased fetal movements, third trimester, not applicable or unspecified: Secondary | ICD-10-CM | POA: Diagnosis not present

## 2022-01-14 DIAGNOSIS — O0932 Supervision of pregnancy with insufficient antenatal care, second trimester: Secondary | ICD-10-CM

## 2022-01-14 DIAGNOSIS — Z3400 Encounter for supervision of normal first pregnancy, unspecified trimester: Secondary | ICD-10-CM

## 2022-01-14 HISTORY — DX: Headache, unspecified: R51.9

## 2022-01-14 HISTORY — DX: Urinary tract infection, site not specified: N39.0

## 2022-01-14 HISTORY — DX: Unspecified ovarian cyst, unspecified side: N83.209

## 2022-01-14 LAB — WET PREP, GENITAL
Sperm: NONE SEEN
Trich, Wet Prep: NONE SEEN
WBC, Wet Prep HPF POC: >=10 — AB (ref <10)
Yeast Wet Prep HPF POC: NONE SEEN

## 2022-01-14 MED ORDER — METRONIDAZOLE 500 MG PO TABS: 500.0000 mg | ORAL_TABLET | Freq: Two times a day (BID) | ORAL | 0 refills | Status: DC

## 2022-01-14 NOTE — MAU Note (Signed)
Rachael Sanchez is a 21 y.o. at [redacted]w[redacted]d here in MAU reporting: hadn't felt movement since yesterday.  Noted blood when went to the bathroom.  Started to feel some pain right before she got here.  "A lot of pressure", constant. More blood when she went to the bathroom here, not as much.  Early in preg was told placenta was low lying, that has since resolved. Denies recent intercourse Baby moving now  Onset of complaint: this morning Pain score: 3 Vitals:   01/14/22 0719  BP: 116/74  Pulse: 97  Resp: 17  Temp: 98.3 F (36.8 C)  SpO2: 100%     FHT:138 Lab orders placed from triage:

## 2022-01-14 NOTE — Progress Notes (Signed)
Subjective:  Rachael Sanchez is a 21 y.o. G1P0 at 79w4dbeing seen today for ongoing prenatal care.  She is currently monitored for the following issues for this low-risk pregnancy and has Supervision of normal first pregnancy, antepartum; Nausea and vomiting in pregnancy; Food insecurity; Prediabetes; Anemia affecting pregnancy; Late prenatal care affecting pregnancy in second trimester; Uterine synechiae-resolved 4/26; Alpha thalassemia silent carrier; and GBS bacteriuria on their problem list.  Patient reports  being seen in MAU earlier this morning .  Contractions: Irritability. Vag. Bleeding: None.  Movement: Present. Denies leaking of fluid.   The following portions of the patient's history were reviewed and updated as appropriate: allergies, current medications, past family history, past medical history, past social history, past surgical history and problem list. Problem list updated.  Objective:   Vitals:   01/14/22 1011  BP: 116/72  Pulse: (!) 104  Weight: 174 lb 12.8 oz (79.3 kg)    Fetal Status: Fetal Heart Rate (bpm): 140 Fundal Height: 33 cm Movement: Present     General:  Alert, oriented and cooperative. Patient is in no acute distress.  Skin: Skin is warm and dry. No rash noted.   Cardiovascular: Normal heart rate noted  Respiratory: Normal respiratory effort, no problems with respiration noted  Abdomen: Soft, gravid, appropriate for gestational age. Pain/Pressure: Present     Pelvic: Vag. Bleeding: None     Cervical exam deferred        Extremities: Normal range of motion.     Mental Status: Normal mood and affect. Normal behavior. Normal judgment and thought content.   Urinalysis:      Assessment and Plan:  Pregnancy: G1P0 at 373w4d1. Anemia affecting pregnancy in third trimester Lab Results  Component Value Date   HGB 9.9 (L) 01/01/2022  mild  2. Late prenatal care affecting pregnancy in second trimester   3. Supervision of normal first pregnancy,  antepartum BP, FH, and FHR normal Seen in MAU earlier this morning with report of vaginal bleeding, nothing seen on speculum exam  4. Alpha thalassemia silent carrier Has partner kit, not completed  5. GBS bacteriuria Ppx in labor  6. Uterine synechiae-resolved 4/26 None seen on last [place] labor symptoms and general obstetric precautions including but not limited to vaginal bleeding, contractions, leaking of fluid and fetal movement were reviewed in detail with the patient. Please refer to After Visit Summary for other counseling recommendations.  Return in 2 weeks (on 01/28/2022) for Dyad patient, ob visit.   EcClarnce FlockMD

## 2022-01-14 NOTE — Patient Instructions (Signed)

## 2022-01-14 NOTE — MAU Provider Note (Cosign Needed Addendum)
Patient Rachael Sanchez is a 21 y.o. G1P0  At [redacted]w[redacted]d here with complaints of decreased fetal movements, an episode of spotting this morning and pain on her left side. She denies chest pain, fever, SOB. She denies abnormal discharge. She noticed blood on her toilet paper and in the toilet this morning at 6:30. She also reported that she had decreased fetal movements last night at 8 pm. She sent a MyChart message about this but has not heard anything back. She did not come to MAU for this last night.  She denies any recent SI.  History     CSN: [id number]  Arrival date and time: 01/14/22 0700   Event Date/Time   First Provider Initiated Contact with Patient 01/14/22 [phone]      Chief Complaint  Patient presents with   Vaginal Bleeding   Decreased Fetal Movement   Abdominal Pain   Vaginal Bleeding The patient's primary symptoms include vaginal bleeding. Associated symptoms include abdominal pain. Pertinent negatives include no constipation, diarrhea, dysuria, fever, urgency or vomiting. The vaginal discharge was bloody. The vaginal bleeding is spotting. She has not been passing clots. She has not been passing tissue.  Abdominal Pain This is a new problem. The current episode started in the past 7 days. The onset quality is gradual. The pain is located in the LUQ. The quality of the pain is described as cramping (happens with a cough or a laugh). The pain does not radiate. Pertinent negatives include no constipation, diarrhea, dysuria, fever or vomiting. Relieved by: flexeril.    OB History     Gravida  1   Para      Term      Preterm      AB      Living         SAB      IAB      Ectopic      Multiple      Live Births              Past Medical History:  Diagnosis Date   Anemia    Chlamydia 2016   Headache    Ovarian cyst    UTI (urinary tract infection)     Past Surgical History:  Procedure Laterality Date   NO PAST SURGERIES      Family History   Problem Relation Age of Onset   Healthy Mother    Diabetes Father     Social History   Tobacco Use   Smoking status: Never   Smokeless tobacco: Never  Vaping Use   Vaping Use: Never used  Substance Use Topics   Alcohol use: Not Currently   Drug use: Not Currently    Types: Marijuana    Comment: not since found out preg    Allergies: No Known Allergies  Medications Prior to Admission  Medication Sig Dispense Refill Last Dose   aspirin EC 81 MG tablet Take 1 tablet (81 mg total) by mouth daily. Take after 12 weeks for prevention of preeclampsia later in pregnancy 300 tablet 2 01/13/2022   cyclobenzaprine (FLEXERIL) 10 MG tablet Take 1 tablet (10 mg total) by mouth 3 (three) times daily as needed (headaches). 30 tablet 2 Past Week   metoCLOPramide (REGLAN) 10 MG tablet Take 10 mg by mouth 4 (four) times daily.   Past Week   prenatal vitamin w/FE, FA (PRENATAL 1 + 1) 27-1 MG TABS tablet Take 1 tablet by mouth daily at 12 noon. 30 tablet 11 01/13/2022  Review of Systems  Constitutional: Negative.  Negative for fever.  HENT: Negative.    Gastrointestinal:  Positive for abdominal pain. Negative for constipation, diarrhea and vomiting.  Genitourinary:  Positive for vaginal bleeding. Negative for dysuria and urgency.  Neurological: Negative.    Physical Exam   Blood pressure 116/74, pulse 97, temperature 98.3 F (36.8 C), temperature source Oral, resp. rate 17, height 5\' 6"  (1.676 m), weight 80 kg, last menstrual period 05/31/2021, SpO2 100 %.  Physical Exam Pulmonary:     Effort: Pulmonary effort is normal.  Abdominal:     General: Abdomen is flat.     Palpations: Abdomen is soft.  Genitourinary:    Vagina: Normal.     Cervix: Normal.     Uterus: Normal.   Skin:    General: Skin is warm.  Neurological:     Mental Status: She is alert.   NEFG; no blood in the vagina, thin amount of grey discharge present, negative Whiff test.  Cervix feels external os is open but  internal os is closed    MAU Course  Procedures  MDM -NST: 140 bpm, mod var, present acel (10 x 10), no decels, no contractions -patient feels strong fetal movements -blood Type is O pos -no further work-up done for abdominal pain as it is lateral to her left breast and appears MSK in nature and patient reports she doesn't feel it now, only she she coughs.  Assessment and Plan   1. Bacterial vaginosis   2. [redacted] weeks gestation of pregnancy   -patient stable for discharge with RX for flagyl, patient would like to be treated even though she does not meet all the criteria for BV -keep fup appt today at [facility] -return to MAU if any concern for bleeding, decreased fetal movements -patient to keep taking flexeril as it helps with her occasional abdominal pain -GC CT pending [facility] Hayley Horn 01/14/2022, 8:25 AM

## 2022-01-15 LAB — GC/CHLAMYDIA PROBE AMP (~~LOC~~) NOT AT ARMC
Chlamydia: NEGATIVE
Comment: NEGATIVE
Comment: NORMAL
Neisseria Gonorrhea: NEGATIVE

## 2022-01-19 ENCOUNTER — Encounter: Payer: Self-pay | Admitting: Obstetrics & Gynecology

## 2022-01-28 ENCOUNTER — Ambulatory Visit (INDEPENDENT_AMBULATORY_CARE_PROVIDER_SITE_OTHER): Payer: Medicaid Other | Admitting: Certified Nurse Midwife

## 2022-01-28 ENCOUNTER — Other Ambulatory Visit: Payer: Self-pay

## 2022-01-28 VITALS — BP 121/68 | HR 104 | Wt 176.7 lb

## 2022-01-28 DIAGNOSIS — M7989 Other specified soft tissue disorders: Secondary | ICD-10-CM

## 2022-01-28 DIAGNOSIS — Z3493 Encounter for supervision of normal pregnancy, unspecified, third trimester: Secondary | ICD-10-CM

## 2022-01-28 DIAGNOSIS — R8271 Bacteriuria: Secondary | ICD-10-CM

## 2022-01-28 DIAGNOSIS — O99013 Anemia complicating pregnancy, third trimester: Secondary | ICD-10-CM

## 2022-01-28 DIAGNOSIS — Z3A34 34 weeks gestation of pregnancy: Secondary | ICD-10-CM

## 2022-01-28 NOTE — Progress Notes (Signed)
   PRENATAL VISIT NOTE  Subjective:  Rachael Sanchez is a 21 y.o. G1P0 at [redacted]w[redacted]d being seen today for ongoing prenatal care.  She is currently monitored for the following issues for this low-risk pregnancy and has Supervision of normal first pregnancy, antepartum; Nausea and vomiting in pregnancy; Food insecurity; Prediabetes; Anemia affecting pregnancy; Late prenatal care affecting pregnancy in second trimester; Uterine synechiae-resolved 4/26; Alpha thalassemia silent carrier; and GBS bacteriuria on their problem list.  Patient reports  feet swelling daily after work .  Contractions: Irritability. Vag. Bleeding: None.  Movement: Present. Denies leaking of fluid.   The following portions of the patient's history were reviewed and updated as appropriate: allergies, current medications, past family history, past medical history, past social history, past surgical history and problem list.   Objective:   Vitals:   01/28/22 1503  BP: 121/68  Pulse: (!) 104  Weight: 176 lb 11.2 oz (80.2 kg)    Fetal Status: Fetal Heart Rate (bpm): 142 Fundal Height: 35 cm Movement: Present  Presentation: Vertex  General:  Alert, oriented and cooperative. Patient is in no acute distress.  Skin: Skin is warm and dry. No rash noted.   Cardiovascular: Normal heart rate noted  Respiratory: Normal respiratory effort, no problems with respiration noted  Abdomen: Soft, gravid, appropriate for gestational age.  Pain/Pressure: Present     Pelvic: Cervical exam deferred        Extremities: Normal range of motion.  Edema: Trace  Mental Status: Normal mood and affect. Normal behavior. Normal judgment and thought content.   Assessment and Plan:  Pregnancy: G1P0 at [redacted]w[redacted]d 1. Encounter for supervision of low-risk pregnancy in third trimester - Doing well, feeling regular and vigorous fetal movement   2. [redacted] weeks gestation of pregnancy - Routine OB care including anticipatory guidance re GC/CT at next visit  3.  GBS bacteriuria - Will treat in labor  4. Anemia affecting pregnancy in third trimester - S/p Venofer infusions x3 - CBC  5. Bilateral swelling of feet - Wearing her compression socks in the mornings, but not at work. Encouraged use at work. - Gave her a maternity belt and assisted with putting it on properly  Preterm labor symptoms and general obstetric precautions including but not limited to vaginal bleeding, contractions, leaking of fluid and fetal movement were reviewed in detail with the patient. Please refer to After Visit Summary for other counseling recommendations.   Return in about 2 weeks (around 02/11/2022) for IN-PERSON, MBROB.  Future Appointments  Date Time Provider Department Center  02/11/2022 10:35 AM Venora Maples, MD [facility]  02/18/2022  3:15 PM Venora Maples, MD [facility]  02/24/2022  3:35 PM Federico Flake, MD [facility]  03/03/2022  3:55 PM Venora Maples, MD [facility]  03/09/2022  9:15 AM [facility]  03/09/2022  9:55 AM Federico Flake, MD [facility]  03/19/2022  2:45 PM [facility]    Bernerd Limbo, CNM

## 2022-01-29 LAB — CBC
Hematocrit: 32.5 % — ABNORMAL LOW (ref 34.0–46.6)
Hemoglobin: 10.1 g/dL — ABNORMAL LOW (ref 11.1–15.9)
MCH: 21.9 pg — ABNORMAL LOW (ref 26.6–33.0)
MCHC: 31.1 g/dL — ABNORMAL LOW (ref 31.5–35.7)
MCV: 70 fL — ABNORMAL LOW (ref 79–97)
Platelets: 162 10*3/uL (ref 150–450)
RBC: 4.62 x10E6/uL (ref 3.77–5.28)
RDW: 18.4 % — ABNORMAL HIGH (ref 11.7–15.4)
WBC: 9.7 10*3/uL (ref 3.4–10.8)

## 2022-02-03 ENCOUNTER — Encounter: Payer: Self-pay | Admitting: Certified Nurse Midwife

## 2022-02-11 ENCOUNTER — Ambulatory Visit (INDEPENDENT_AMBULATORY_CARE_PROVIDER_SITE_OTHER): Payer: Medicaid Other | Admitting: Family Medicine

## 2022-02-11 ENCOUNTER — Other Ambulatory Visit: Payer: Self-pay

## 2022-02-11 ENCOUNTER — Other Ambulatory Visit (HOSPITAL_COMMUNITY)
Admission: RE | Admit: 2022-02-11 | Discharge: 2022-02-11 | Disposition: A | Discharge status: Home / Self Care | Payer: Medicaid Other | Source: Ambulatory Visit | Attending: Family Medicine | Admitting: Family Medicine

## 2022-02-11 ENCOUNTER — Encounter: Payer: Self-pay | Admitting: Family Medicine

## 2022-02-11 VITALS — BP 115/72 | HR 91 | Wt 180.8 lb

## 2022-02-11 DIAGNOSIS — O4690 Antepartum hemorrhage, unspecified, unspecified trimester: Secondary | ICD-10-CM | POA: Diagnosis present

## 2022-02-11 DIAGNOSIS — N856 Intrauterine synechiae: Secondary | ICD-10-CM

## 2022-02-11 DIAGNOSIS — Z3400 Encounter for supervision of normal first pregnancy, unspecified trimester: Secondary | ICD-10-CM

## 2022-02-11 DIAGNOSIS — O0932 Supervision of pregnancy with insufficient antenatal care, second trimester: Secondary | ICD-10-CM

## 2022-02-11 DIAGNOSIS — O99013 Anemia complicating pregnancy, third trimester: Secondary | ICD-10-CM

## 2022-02-11 DIAGNOSIS — R8271 Bacteriuria: Secondary | ICD-10-CM

## 2022-02-11 DIAGNOSIS — D563 Thalassemia minor: Secondary | ICD-10-CM

## 2022-02-11 NOTE — Progress Notes (Signed)
   Subjective:  Rachael Sanchez is a 21 y.o. G1P0 at [redacted]w[redacted]d being seen today for ongoing prenatal care.  She is currently monitored for the following issues for this low-risk pregnancy and has Supervision of normal first pregnancy, antepartum; Nausea and vomiting in pregnancy; Food insecurity; Prediabetes; Anemia affecting pregnancy; Late prenatal care affecting pregnancy in second trimester; Uterine synechiae-resolved 4/26; Alpha thalassemia silent carrier; and GBS bacteriuria on their problem list.  Patient reports  daily spotting in the morning for several weeks .  Contractions: Irritability. Vag. Bleeding: Scant.  Movement: Present. Denies leaking of fluid.   Seen in MAU for the same issue on 01/14/2022, feels it has been about the same since then Shows some pictures of bloody discharge  The following portions of the patient's history were reviewed and updated as appropriate: allergies, current medications, past family history, past medical history, past social history, past surgical history and problem list. Problem list updated.  Objective:   Vitals:   02/11/22 1029  BP: 115/72  Pulse: 91  Weight: 180 lb 12.8 oz (82 kg)    Fetal Status: Fetal Heart Rate (bpm): 145   Movement: Present  Presentation: Vertex  General:  Alert, oriented and cooperative. Patient is in no acute distress.  Skin: Skin is warm and dry. No rash noted.   Cardiovascular: Normal heart rate noted  Respiratory: Normal respiratory effort, no problems with respiration noted  Abdomen: Soft, gravid, appropriate for gestational age. Pain/Pressure: Present     Pelvic: Vag. Bleeding: Scant     Cervical exam performed Dilation: Closed Effacement (%): Thick Station: -2  Extremities: Normal range of motion.     Mental Status: Normal mood and affect. Normal behavior. Normal judgment and thought content.   Urinalysis:      Assessment and Plan:  Pregnancy: G1P0 at [redacted]w[redacted]d  1. Anemia affecting pregnancy in third  trimester S/p IV venofer with good response Lab Results  Component Value Date   HGB 10.1 (L) 01/28/2022    2. Late prenatal care affecting pregnancy in second trimester   3. Supervision of normal first pregnancy, antepartum BP and FHR normal GBS bacteriuria Self swab collected for GC/CT SSE shows no blood in vault, friable cervix SVE with mildly open external os, closed internal os, which is unchanged from prior exam in MAU a few weeks ago Reassured patient likely due to combination of friable cervix/early labor changes, at present no concerning findings Discussed MAU precautions in detail Needs FMLA paperwork completed, directed to front desk  4. GBS bacteriuria Ppx in labor  5. Uterine synechiae-resolved 4/26 None seen on most recent US  Preterm labor symptoms and general obstetric precautions including but not limited to vaginal bleeding, contractions, leaking of fluid and fetal movement were reviewed in detail with the patient. Please refer to After Visit Summary for other counseling recommendations.  Return in 1 week (on 02/18/2022) for Dyad patient, ob visit.   Venora Maples, MD

## 2022-02-11 NOTE — Patient Instructions (Signed)

## 2022-02-12 LAB — CERVICOVAGINAL ANCILLARY ONLY
Bacterial Vaginitis (gardnerella): POSITIVE — AB
Candida Glabrata: NEGATIVE
Candida Vaginitis: POSITIVE — AB
Chlamydia: NEGATIVE
Comment: NEGATIVE
Comment: NEGATIVE
Comment: NEGATIVE
Comment: NEGATIVE
Comment: NEGATIVE
Comment: NORMAL
Neisseria Gonorrhea: NEGATIVE
Trichomonas: NEGATIVE

## 2022-02-12 MED ORDER — METRONIDAZOLE 0.75 % VA GEL: 1.0000 | Freq: Every day | VAGINAL | 0 refills | Status: AC

## 2022-02-12 MED ORDER — FLUCONAZOLE 150 MG PO TABS: 150.0000 mg | ORAL_TABLET | Freq: Once | ORAL | 0 refills | Status: AC

## 2022-02-12 NOTE — Addendum Note (Signed)
Addended by: Merian Capron on: 02/12/2022 02:16 PM   Modules accepted: Orders

## 2022-02-18 ENCOUNTER — Other Ambulatory Visit: Payer: Self-pay

## 2022-02-18 ENCOUNTER — Ambulatory Visit (INDEPENDENT_AMBULATORY_CARE_PROVIDER_SITE_OTHER): Payer: Medicaid Other | Admitting: Family Medicine

## 2022-02-18 VITALS — BP 99/64 | HR 97 | Wt 180.5 lb

## 2022-02-18 DIAGNOSIS — D563 Thalassemia minor: Secondary | ICD-10-CM

## 2022-02-18 DIAGNOSIS — O99013 Anemia complicating pregnancy, third trimester: Secondary | ICD-10-CM

## 2022-02-18 DIAGNOSIS — Z3400 Encounter for supervision of normal first pregnancy, unspecified trimester: Secondary | ICD-10-CM

## 2022-02-18 DIAGNOSIS — R8271 Bacteriuria: Secondary | ICD-10-CM

## 2022-02-18 DIAGNOSIS — O0932 Supervision of pregnancy with insufficient antenatal care, second trimester: Secondary | ICD-10-CM

## 2022-02-18 NOTE — Progress Notes (Signed)
   Subjective:  Rachael Sanchez is a 21 y.o. G1P0 at [redacted]w[redacted]d being seen today for ongoing prenatal care.  She is currently monitored for the following issues for this low-risk pregnancy and has Supervision of normal first pregnancy, antepartum; Nausea and vomiting in pregnancy; Food insecurity; Prediabetes; Anemia affecting pregnancy; Late prenatal care affecting pregnancy in second trimester; Uterine synechiae-resolved 4/26; Alpha thalassemia silent carrier; and GBS bacteriuria on their problem list.  Patient reports no complaints.  Contractions: Irritability. Vag. Bleeding: None.  Movement: Present. Denies leaking of fluid.   The following portions of the patient's history were reviewed and updated as appropriate: allergies, current medications, past family history, past medical history, past social history, past surgical history and problem list. Problem list updated.  Objective:   Vitals:   02/18/22 1521  BP: 99/64  Pulse: 97  Weight: 180 lb 8 oz (81.9 kg)    Fetal Status: Fetal Heart Rate (bpm): 136   Movement: Present     General:  Alert, oriented and cooperative. Patient is in no acute distress.  Skin: Skin is warm and dry. No rash noted.   Cardiovascular: Normal heart rate noted  Respiratory: Normal respiratory effort, no problems with respiration noted  Abdomen: Soft, gravid, appropriate for gestational age. Pain/Pressure: Present     Pelvic: Vag. Bleeding: None     Cervical exam deferred        Extremities: Normal range of motion.  Edema: Trace  Mental Status: Normal mood and affect. Normal behavior. Normal judgment and thought content.   Urinalysis:      Assessment and Plan:  Pregnancy: G1P0 at [redacted]w[redacted]d  1. Anemia affecting pregnancy in third trimester Much improved s/p IV venofer  2. Late prenatal care affecting pregnancy in second trimester   3. Supervision of normal first pregnancy, antepartum BP and FHR normal Strongly desires elective IOL, schedule reviewed  and will schedule for 03/02/2022 in the AM Orders placed, form faxed  4. Alpha thalassemia silent carrier   5. GBS bacteriuria PPX in labor  Term labor symptoms and general obstetric precautions including but not limited to vaginal bleeding, contractions, leaking of fluid and fetal movement were reviewed in detail with the patient. Please refer to After Visit Summary for other counseling recommendations.  Return in 1 week (on 02/25/2022) for Dyad patient, ob visit.   Venora Maples, MD

## 2022-02-18 NOTE — Patient Instructions (Signed)

## 2022-02-19 ENCOUNTER — Telehealth (HOSPITAL_COMMUNITY): Payer: Self-pay | Admitting: *Deleted

## 2022-02-19 ENCOUNTER — Encounter (HOSPITAL_COMMUNITY): Payer: Self-pay | Admitting: *Deleted

## 2022-02-19 NOTE — Telephone Encounter (Signed)
Preadmission screen  

## 2022-02-22 ENCOUNTER — Other Ambulatory Visit: Payer: Self-pay | Admitting: Advanced Practice Midwife

## 2022-02-24 ENCOUNTER — Other Ambulatory Visit: Payer: Self-pay

## 2022-02-24 ENCOUNTER — Encounter: Payer: Self-pay | Admitting: Family Medicine

## 2022-02-24 ENCOUNTER — Ambulatory Visit (INDEPENDENT_AMBULATORY_CARE_PROVIDER_SITE_OTHER): Payer: Medicaid Other | Admitting: Family Medicine

## 2022-02-24 VITALS — BP 122/82 | HR 102 | Wt 183.5 lb

## 2022-02-24 DIAGNOSIS — D563 Thalassemia minor: Secondary | ICD-10-CM

## 2022-02-24 DIAGNOSIS — O0932 Supervision of pregnancy with insufficient antenatal care, second trimester: Secondary | ICD-10-CM

## 2022-02-24 DIAGNOSIS — R519 Headache, unspecified: Secondary | ICD-10-CM

## 2022-02-24 DIAGNOSIS — Z3400 Encounter for supervision of normal first pregnancy, unspecified trimester: Secondary | ICD-10-CM

## 2022-02-24 DIAGNOSIS — O99013 Anemia complicating pregnancy, third trimester: Secondary | ICD-10-CM

## 2022-02-24 DIAGNOSIS — O26899 Other specified pregnancy related conditions, unspecified trimester: Secondary | ICD-10-CM

## 2022-02-24 MED ORDER — CYCLOBENZAPRINE HCL 10 MG PO TABS: 10.0000 mg | ORAL_TABLET | Freq: Three times a day (TID) | ORAL | 2 refills | Status: DC | PRN

## 2022-02-24 NOTE — Progress Notes (Signed)
Pt reports light bleeding for the last couple of weeks now. Also wants to know how much her baby weighs now.

## 2022-02-24 NOTE — Progress Notes (Signed)
   PRENATAL VISIT NOTE  Subjective:  Rachael Sanchez is a 21 y.o. G1P0 at [redacted]w[redacted]d being seen today for ongoing prenatal care.  She is currently monitored for the following issues for this low-risk pregnancy and has Supervision of normal first pregnancy, antepartum; Nausea and vomiting in pregnancy; Food insecurity; Prediabetes; Anemia affecting pregnancy; Late prenatal care affecting pregnancy in second trimester; Uterine synechiae-resolved 4/26; Alpha thalassemia silent carrier; and GBS bacteriuria on their problem list.  Patient reports bleeding.  Contractions: Irritability. Vag. Bleeding: Scant.  Movement: Present. Denies leaking of fluid.   The following portions of the patient's history were reviewed and updated as appropriate: allergies, current medications, past family history, past medical history, past social history, past surgical history and problem list.   Objective:   Vitals:   02/24/22 1555  BP: 122/82  Pulse: (!) 102  Weight: 183 lb 8 oz (83.2 kg)    Fetal Status: Fetal Heart Rate (bpm): 143 Fundal Height: 38 cm Movement: Present  Presentation: Vertex  General:  Alert, oriented and cooperative. Patient is in no acute distress.  Skin: Skin is warm and dry. No rash noted.   Cardiovascular: Normal heart rate noted  Respiratory: Normal respiratory effort, no problems with respiration noted  Abdomen: Soft, gravid, appropriate for gestational age.  Pain/Pressure: Present     Pelvic: Cervical exam performed in the presence of a chaperone Dilation: 1.5 Effacement (%): 50 Station: -2  Extremities: Normal range of motion.  Edema: Trace  Mental Status: Normal mood and affect. Normal behavior. Normal judgment and thought content.   Assessment and Plan:  Pregnancy: G1P0 at [redacted]w[redacted]d  1. Anemia affecting pregnancy in third trimester Improving  2. Late prenatal care affecting pregnancy in second trimester Up to date  3. Supervision of normal first pregnancy, antepartum Doing  well Discussed that Dr. Crissie Reese or myself might be available for delivery but also might have another provider.  Scheduled for IOL 8/14- this is an ELECTIVE IOL that patient requested VB reported by patient -- likely due to cervical change  Dilation: 1.5 Effacement (%): 50 Station: -2 Presentation: Vertex  Needs PCN in labor  4. Alpha thalassemia silent carrier  5. Headache in pregnancy, antepartum - cyclobenzaprine (FLEXERIL) 10 MG tablet; Take 1 tablet (10 mg total) by mouth 3 (three) times daily as needed (headaches).  Dispense: 30 tablet; Refill: 2  Term labor symptoms and general obstetric precautions including but not limited to vaginal bleeding, contractions, leaking of fluid and fetal movement were reviewed in detail with the patient. Please refer to After Visit Summary for other counseling recommendations.   Return in about 1 week (around 03/03/2022) for IOL at [facility].  Future Appointments  Date Time Provider Department Center  03/02/2022  7:45 AM [facility] SCHED ROOM [facility] None  03/03/2022  3:55 PM Venora Maples, MD [facility]  03/09/2022  9:15 AM [facility] N[facility]  03/09/2022  9:55 AM Federico Flake, MD [facility]  03/19/2022  2:45 PM [facility]    Federico Flake, MD

## 2022-03-02 ENCOUNTER — Inpatient Hospital Stay (HOSPITAL_COMMUNITY): Payer: Medicaid Other

## 2022-03-03 ENCOUNTER — Encounter (HOSPITAL_COMMUNITY): Payer: Self-pay | Admitting: Family Medicine

## 2022-03-03 ENCOUNTER — Other Ambulatory Visit: Payer: Self-pay

## 2022-03-03 ENCOUNTER — Inpatient Hospital Stay (HOSPITAL_COMMUNITY)
Admission: AD | Admit: 2022-03-03 | Discharge: 2022-03-06 | DRG: 768 | Disposition: A | Discharge status: Home / Self Care | Payer: Medicaid Other | Attending: Obstetrics and Gynecology | Admitting: Obstetrics and Gynecology

## 2022-03-03 ENCOUNTER — Ambulatory Visit (INDEPENDENT_AMBULATORY_CARE_PROVIDER_SITE_OTHER): Payer: Medicaid Other | Admitting: Family Medicine

## 2022-03-03 ENCOUNTER — Encounter: Payer: Self-pay | Admitting: Family Medicine

## 2022-03-03 DIAGNOSIS — Z3400 Encounter for supervision of normal first pregnancy, unspecified trimester: Secondary | ICD-10-CM

## 2022-03-03 DIAGNOSIS — D563 Thalassemia minor: Secondary | ICD-10-CM | POA: Diagnosis present

## 2022-03-03 DIAGNOSIS — Z3A39 39 weeks gestation of pregnancy: Secondary | ICD-10-CM | POA: Diagnosis not present

## 2022-03-03 DIAGNOSIS — O99824 Streptococcus B carrier state complicating childbirth: Secondary | ICD-10-CM | POA: Diagnosis present

## 2022-03-03 DIAGNOSIS — O26893 Other specified pregnancy related conditions, third trimester: Secondary | ICD-10-CM | POA: Diagnosis present

## 2022-03-03 DIAGNOSIS — Z87891 Personal history of nicotine dependence: Secondary | ICD-10-CM

## 2022-03-03 DIAGNOSIS — O99820 Streptococcus B carrier state complicating pregnancy: Secondary | ICD-10-CM | POA: Diagnosis not present

## 2022-03-03 DIAGNOSIS — Z3490 Encounter for supervision of normal pregnancy, unspecified, unspecified trimester: Secondary | ICD-10-CM | POA: Diagnosis present

## 2022-03-03 LAB — CBC
HCT: 37.9 % (ref 36.0–46.0)
Hemoglobin: 11.9 g/dL — ABNORMAL LOW (ref 12.0–15.0)
MCH: 23.1 pg — ABNORMAL LOW (ref 26.0–34.0)
MCHC: 31.4 g/dL (ref 30.0–36.0)
MCV: 73.6 fL — ABNORMAL LOW (ref 80.0–100.0)
Platelets: 149 10*3/uL — ABNORMAL LOW (ref 150–400)
RBC: 5.15 MIL/uL — ABNORMAL HIGH (ref 3.87–5.11)
RDW: 15.9 % — ABNORMAL HIGH (ref 11.5–15.5)
WBC: 8 10*3/uL (ref 4.0–10.5)
nRBC: 0 % (ref 0.0–0.2)

## 2022-03-03 MED ORDER — SODIUM CHLORIDE 0.9 % IV SOLN
5.0000 10*6.[IU] | Freq: Once | INTRAVENOUS | Status: AC
  Administered 2022-03-03: 5 10*6.[IU] via INTRAVENOUS
  Filled 2022-03-03: qty 5

## 2022-03-03 MED ORDER — LACTATED RINGERS IV SOLN: INTRAVENOUS | Status: DC

## 2022-03-03 MED ORDER — MISOPROSTOL 50MCG HALF TABLET: 50.0000 ug | ORAL_TABLET | Freq: Once | ORAL | Status: DC

## 2022-03-03 MED ORDER — PENICILLIN G POT IN DEXTROSE 60000 UNIT/ML IV SOLN
3.0000 10*6.[IU] | INTRAVENOUS | Status: DC
  Administered 2022-03-03 – 2022-03-04 (×4): 3 10*6.[IU] via INTRAVENOUS
  Filled 2022-03-03 (×7): qty 50

## 2022-03-03 MED ORDER — OXYTOCIN BOLUS FROM INFUSION
333.0000 mL | Freq: Once | INTRAVENOUS | Status: AC
  Administered 2022-03-04: 333 mL via INTRAVENOUS

## 2022-03-03 MED ORDER — FENTANYL CITRATE (PF) 100 MCG/2ML IJ SOLN
50.0000 ug | INTRAMUSCULAR | Status: DC | PRN
  Administered 2022-03-03 – 2022-03-04 (×4): 100 ug via INTRAVENOUS
  Filled 2022-03-03 (×4): qty 2

## 2022-03-03 MED ORDER — ACETAMINOPHEN 325 MG PO TABS: 650.0000 mg | ORAL_TABLET | ORAL | Status: DC | PRN

## 2022-03-03 MED ORDER — LACTATED RINGERS IV SOLN
500.0000 mL | INTRAVENOUS | Status: DC | PRN
  Administered 2022-03-04: 500 mL via INTRAVENOUS

## 2022-03-03 MED ORDER — TERBUTALINE SULFATE 1 MG/ML IJ SOLN
0.2500 mg | Freq: Once | INTRAMUSCULAR | Status: AC | PRN
  Administered 2022-03-04: 0.25 mg via SUBCUTANEOUS
  Filled 2022-03-03: qty 1

## 2022-03-03 MED ORDER — LIDOCAINE HCL (PF) 1 % IJ SOLN: 30.0000 mL | INTRAMUSCULAR | Status: DC | PRN

## 2022-03-03 MED ORDER — MISOPROSTOL 50MCG HALF TABLET
50.0000 ug | ORAL_TABLET | ORAL | Status: DC
  Administered 2022-03-03 – 2022-03-04 (×3): 50 ug via BUCCAL
  Filled 2022-03-03 (×3): qty 1

## 2022-03-03 MED ORDER — ONDANSETRON HCL 4 MG/2ML IJ SOLN: 4.0000 mg | Freq: Four times a day (QID) | INTRAMUSCULAR | Status: DC | PRN

## 2022-03-03 MED ORDER — SOD CITRATE-CITRIC ACID 500-334 MG/5ML PO SOLN: 30.0000 mL | ORAL | Status: DC | PRN

## 2022-03-03 MED ORDER — MISOPROSTOL 50MCG HALF TABLET
50.0000 ug | ORAL_TABLET | Freq: Once | ORAL | Status: AC
  Administered 2022-03-03: 50 ug via ORAL
  Filled 2022-03-03: qty 1

## 2022-03-03 MED ORDER — OXYTOCIN-SODIUM CHLORIDE 30-0.9 UT/500ML-% IV SOLN
2.5000 [IU]/h | INTRAVENOUS | Status: DC
  Filled 2022-03-03: qty 500

## 2022-03-03 NOTE — H&P (Signed)
OBSTETRIC ADMISSION HISTORY AND PHYSICAL  Rachael Sanchez is a 21 y.o. female G1P0 with IUP at [redacted]w[redacted]d by LMP presenting for elective IOL. She reports +FMs, No LOF, no VB, no blurry vision, headaches or peripheral edema, and RUQ pain.  She plans on bottle feeding. She request depo for birth control. She received her prenatal care at [facility]   Dating: By LMP --->  Estimated Date of Delivery: 03/07/22   Nursing Staff Provider  Office Location  [facility] Dating  LMP c/w 6 wk [place]  [facility] Model [ ]  Traditional [ ]  Centering ] Mom-Baby Dyad    Language  English Anatomy  normal  Flu Vaccine  declined Genetic/Carrier Screen  NIPS:   Low risk Female AFP:   normal Horizon: silent alpha thal  TDaP Vaccine   12/17/2021 Hgb A1C or  GTT Early - abnormal 5.7% > 2hr GTT normal Third trimester - normal  COVID Vaccine No   LAB RESULTS   Rhogam  n/a Blood Type O/Positive/-- (03/13 1315)   Baby Feeding Plan Breast/ Bottle Antibody Negative (03/13 1315)  Contraception Depo Rubella 8.43 (03/13 1315)  Circumcision Yes RPR Non Reactive (03/13 1315)   Pediatrician  Mom Baby Dyad HBsAg Negative (03/13 1315)   Support Person William(FOB) HCVAb neg  Prenatal Classes  HIV Non Reactive (03/13 1315)     BTL Consent NA GBS   +urine culture  VBAC Consent NA Pap 09/29/2021 - NILM       DME Rx 09-11-2006 ] BP cuff [ ]  Weight Scale Waterbirth  [ ]  Class [ ]  Consent [ ]  CNM visit  PHQ9 & GAD7 [ x ] new OB [  ] 28 weeks  [  ] 36 weeks Induction  [ ]  Orders Entered [ ] Foley Y/N    Prenatal History/Complications: anemia  Past Medical History: Past Medical History:  Diagnosis Date   Anemia    Chlamydia 2016   Headache    Ovarian cyst    UTI (urinary tract infection)     Past Surgical History: Past Surgical History:  Procedure Laterality Date   NO PAST SURGERIES      Obstetrical History: OB History     Gravida  1   Para      Term      Preterm      AB      Living         SAB      IAB       Ectopic      Multiple      Live Births              Social History Social History   Socioeconomic History   Marital status: Single    Spouse name: Not on file   Number of children: Not on file   Years of education: Not on file   Highest education level: Not on file  Occupational History   Not on file  Tobacco Use   Smoking status: Former    Types: Cigarettes    Quit date: 09/17/2021    Years since quitting: 0.4   Smokeless tobacco: Never  Vaping Use   Vaping Use: Never used  Substance and Sexual Activity   Alcohol use: Not Currently   Drug use: Not Currently    Types: Marijuana    Comment: not since found out preg   Sexual activity: Yes    Birth control/protection: None  Other Topics Concern   Not on file  Social  History Narrative   Not on file   Social Determinants of Health   Financial Resource Strain: Not on file  Food Insecurity: Food Insecurity Present (02/11/2022)   Hunger Vital Sign    Worried About Running Out of Food in the Last Year: Sometimes true    Ran Out of Food in the Last Year: Sometimes true  Transportation Needs: No Transportation Needs (02/11/2022)   PRAPARE - Administrator, Civil Service (Medical): No    Lack of Transportation (Non-Medical): No  Physical Activity: Not on file  Stress: Not on file  Social Connections: Not on file    Family History: Family History  Problem Relation Age of Onset   Healthy Mother    Diabetes Father     Allergies: No Known Allergies  Medications Prior to Admission  Medication Sig Dispense Refill Last Dose   aspirin EC 81 MG tablet Take 1 tablet (81 mg total) by mouth daily. Take after 12 weeks for prevention of preeclampsia later in pregnancy 300 tablet 2    cyclobenzaprine (FLEXERIL) 10 MG tablet Take 1 tablet (10 mg total) by mouth 3 (three) times daily as needed (headaches). 30 tablet 2    prenatal vitamin w/FE, FA (PRENATAL 1 + 1) 27-1 MG TABS tablet Take 1 tablet by mouth daily at 12  noon. 30 tablet 11      Review of Systems   All systems reviewed and negative except as stated in HPI  Height 5\' 6"  (1.676 m), weight 84.1 kg, last menstrual period 05/31/2021. General appearance: alert, cooperative, and no distress Lungs: clear to auscultation bilaterally Heart: regular rate and rhythm Abdomen: soft, non-tender; bowel sounds normal Pelvic: n/a Extremities: Homans sign is negative, no sign of DVT DTR's +2 Presentation: cephalic Fetal monitoringBaseline: 130 bpm, Variability: Good {> 6 bpm), Accelerations: Reactive, and Decelerations: Absent Uterine activityNone     Prenatal labs: ABO, Rh: O/Positive/-- (03/13 1315) Antibody: Negative (03/13 1315) Rubella: 8.43 (03/13 1315) RPR: Non Reactive (05/31 0851)  HBsAg: Negative (03/13 1315)  HIV: Non Reactive (05/31 0851)  GBS:   positive  Prenatal Transfer Tool  Maternal Diabetes: No Genetic Screening: Normal Maternal Ultrasounds/Referrals: Normal Fetal Ultrasounds or other Referrals:  None Maternal Substance Abuse:  No Significant Maternal Medications:  None Significant Maternal Lab Results: Group B Strep positive  No results found for this or any previous visit (from the past 24 hour(s)).  Patient Active Problem List   Diagnosis Date Noted   Encounter for elective induction of labor 03/03/2022   GBS bacteriuria 12/17/2021   Alpha thalassemia silent carrier 11/03/2021   Uterine synechiae-resolved 4/26 10/14/2021   Prediabetes 09/30/2021   Anemia affecting pregnancy 09/30/2021   Late prenatal care affecting pregnancy in second trimester 09/30/2021   Nausea and vomiting in pregnancy 09/29/2021   Food insecurity 09/29/2021   Supervision of normal first pregnancy, antepartum 08/13/2021    Assessment/Plan:  Rachael Sanchez is a 21 y.o. G1P0 at [redacted]w[redacted]d here for elective IOL  #Labor: methods for IOL reviewed at length. Outer os 1.5cm but closed internally. Will start with cytotec #Pain: Per patient  request #FWB: Cat 1 #ID:  GBS pos- PCN #MOF: bottle #MOC: depo #Circ:  N/A  [redacted]w[redacted]d, CNM  03/03/2022, 12:43 PM

## 2022-03-03 NOTE — Progress Notes (Signed)
Labor Progress Note Rachael Sanchez is a 21 y.o. G1P0 at [redacted]w[redacted]d presented for eIOL.   S: Feeling tired.   O:  BP 135/67   Pulse 92   Temp 98.6 F (37 C) (Oral)   Resp 16   Ht 5\' 6"  (1.676 m)   Wt 84.1 kg   LMP 05/31/2021 (Exact Date)   BMI 29.94 kg/m  EFM: 120bpm/moderate/+accels, no decels  CVE: Dilation: 2 Effacement (%): 60 Cervical Position: Middle Station: -3 Presentation: Vertex Exam by:: [email or web address], DO   A&P: 21 y.o. G1P0 [redacted]w[redacted]d here for eIOL.  #Labor: s/p cytotec x2 without significant cervical change. Offered FB v. Cytotec and patient desires to avoid FB at this time. Will consider at next cervical exam v. Pitocin if appropriate.  #Pain: Maternally supported, planning for epidural #FWB: Cat I #GBS positive  Rachael Scheuermann Autry-Lott, DO 10:00 PM

## 2022-03-03 NOTE — Progress Notes (Signed)
Labor Progress Note Rachael Sanchez is a 21 y.o. G1P0 at [redacted]w[redacted]d presented for elective IOL   S:  Patient comfortable, declines FB placement  O:  BP 118/73   Pulse (!) 106   Temp 98.4 F (36.9 C) (Oral)   Resp 16   Ht 5\' 6"  (1.676 m)   Wt 84.1 kg   LMP 05/31/2021 (Exact Date)   BMI 29.94 kg/m    Fetal Tracing:  Baseline: 135 Variability: moderate Accels: 15x15 Decels: none  Toco: 2-7   CVE: Dilation: 2 Effacement (%): 50 Cervical Position: Middle Station: -2 Presentation: Vertex Exam by:: k fields, rn   A&P: 21 y.o. G1P0 [redacted]w[redacted]d elective IOL #Labor: Progressing well. Continue cytotec #Pain: per patient request #FWB: Cat 1 #GBS positive  [redacted]w[redacted]d, CNM 5:17 PM

## 2022-03-04 ENCOUNTER — Inpatient Hospital Stay (HOSPITAL_COMMUNITY): Payer: Medicaid Other | Admitting: Anesthesiology

## 2022-03-04 ENCOUNTER — Encounter (HOSPITAL_COMMUNITY): Payer: Self-pay | Admitting: Obstetrics and Gynecology

## 2022-03-04 DIAGNOSIS — O99820 Streptococcus B carrier state complicating pregnancy: Secondary | ICD-10-CM

## 2022-03-04 DIAGNOSIS — Z3A39 39 weeks gestation of pregnancy: Secondary | ICD-10-CM

## 2022-03-04 LAB — CBC
HCT: 33.4 % — ABNORMAL LOW (ref 36.0–46.0)
Hemoglobin: 10.6 g/dL — ABNORMAL LOW (ref 12.0–15.0)
MCH: 23.2 pg — ABNORMAL LOW (ref 26.0–34.0)
MCHC: 31.7 g/dL (ref 30.0–36.0)
MCV: 73.1 fL — ABNORMAL LOW (ref 80.0–100.0)
Platelets: 143 10*3/uL — ABNORMAL LOW (ref 150–400)
RBC: 4.57 MIL/uL (ref 3.87–5.11)
RDW: 15.8 % — ABNORMAL HIGH (ref 11.5–15.5)
WBC: 12.4 10*3/uL — ABNORMAL HIGH (ref 4.0–10.5)
nRBC: 0 % (ref 0.0–0.2)

## 2022-03-04 LAB — RPR: RPR Ser Ql: NONREACTIVE

## 2022-03-04 MED ORDER — BENZOCAINE-MENTHOL 20-0.5 % EX AERO
1.0000 | INHALATION_SPRAY | CUTANEOUS | Status: DC | PRN
  Filled 2022-03-04: qty 56

## 2022-03-04 MED ORDER — ONDANSETRON HCL 4 MG/2ML IJ SOLN: 4.0000 mg | INTRAMUSCULAR | Status: DC | PRN

## 2022-03-04 MED ORDER — ONDANSETRON HCL 4 MG PO TABS: 4.0000 mg | ORAL_TABLET | ORAL | Status: DC | PRN

## 2022-03-04 MED ORDER — LACTATED RINGERS IV SOLN: 500.0000 mL | Freq: Once | INTRAVENOUS | Status: DC

## 2022-03-04 MED ORDER — METHYLERGONOVINE MALEATE 0.2 MG/ML IJ SOLN
INTRAMUSCULAR | Status: AC
  Filled 2022-03-04: qty 1

## 2022-03-04 MED ORDER — EPHEDRINE 5 MG/ML INJ: 10.0000 mg | INTRAVENOUS | Status: DC | PRN

## 2022-03-04 MED ORDER — DIPHENHYDRAMINE HCL 50 MG/ML IJ SOLN: 12.5000 mg | INTRAMUSCULAR | Status: DC | PRN

## 2022-03-04 MED ORDER — COCONUT OIL OIL: 1.0000 | TOPICAL_OIL | Status: DC | PRN

## 2022-03-04 MED ORDER — MISOPROSTOL 200 MCG PO TABS
ORAL_TABLET | ORAL | Status: AC
  Filled 2022-03-04: qty 4

## 2022-03-04 MED ORDER — LACTATED RINGERS AMNIOINFUSION
INTRAVENOUS | Status: DC
  Filled 2022-03-04 (×2): qty 1000

## 2022-03-04 MED ORDER — PRENATAL MULTIVITAMIN CH
1.0000 | ORAL_TABLET | Freq: Every day | ORAL | Status: DC
  Administered 2022-03-04 – 2022-03-06 (×3): 1 via ORAL
  Filled 2022-03-04 (×3): qty 1

## 2022-03-04 MED ORDER — WITCH HAZEL-GLYCERIN EX PADS: 1.0000 | MEDICATED_PAD | CUTANEOUS | Status: DC | PRN

## 2022-03-04 MED ORDER — FENTANYL-BUPIVACAINE-NACL 0.5-0.125-0.9 MG/250ML-% EP SOLN
12.0000 mL/h | EPIDURAL | Status: DC | PRN
  Administered 2022-03-04: 12 mL/h via EPIDURAL

## 2022-03-04 MED ORDER — ZOLPIDEM TARTRATE 5 MG PO TABS: 5.0000 mg | ORAL_TABLET | Freq: Every evening | ORAL | Status: DC | PRN

## 2022-03-04 MED ORDER — PHENYLEPHRINE 80 MCG/ML (10ML) SYRINGE FOR IV PUSH (FOR BLOOD PRESSURE SUPPORT)
80.0000 ug | PREFILLED_SYRINGE | INTRAVENOUS | Status: DC | PRN
  Administered 2022-03-04: 80 ug via INTRAVENOUS

## 2022-03-04 MED ORDER — TETANUS-DIPHTH-ACELL PERTUSSIS 5-2.5-18.5 LF-MCG/0.5 IM SUSY: 0.5000 mL | PREFILLED_SYRINGE | Freq: Once | INTRAMUSCULAR | Status: DC

## 2022-03-04 MED ORDER — PHENYLEPHRINE 80 MCG/ML (10ML) SYRINGE FOR IV PUSH (FOR BLOOD PRESSURE SUPPORT)
80.0000 ug | PREFILLED_SYRINGE | INTRAVENOUS | Status: DC | PRN
  Filled 2022-03-04: qty 10

## 2022-03-04 MED ORDER — TRANEXAMIC ACID-NACL 1000-0.7 MG/100ML-% IV SOLN
INTRAVENOUS | Status: AC
  Administered 2022-03-04: 1000 mg
  Filled 2022-03-04: qty 100

## 2022-03-04 MED ORDER — METHYLERGONOVINE MALEATE 0.2 MG/ML IJ SOLN
0.2000 mg | Freq: Once | INTRAMUSCULAR | Status: AC
Start: 2022-03-04 — End: 2022-03-04
  Administered 2022-03-04: 0.2 mg via INTRAMUSCULAR

## 2022-03-04 MED ORDER — DIPHENHYDRAMINE HCL 25 MG PO CAPS: 25.0000 mg | ORAL_CAPSULE | Freq: Four times a day (QID) | ORAL | Status: DC | PRN

## 2022-03-04 MED ORDER — SENNOSIDES-DOCUSATE SODIUM 8.6-50 MG PO TABS
2.0000 | ORAL_TABLET | ORAL | Status: DC
  Administered 2022-03-04 – 2022-03-05 (×2): 2 via ORAL
  Filled 2022-03-04 (×2): qty 2

## 2022-03-04 MED ORDER — LIDOCAINE-EPINEPHRINE (PF) 1.5 %-1:200000 IJ SOLN
INTRAMUSCULAR | Status: DC | PRN
  Administered 2022-03-04: 5 mL via EPIDURAL

## 2022-03-04 MED ORDER — FERROUS SULFATE 325 (65 FE) MG PO TABS
325.0000 mg | ORAL_TABLET | Freq: Two times a day (BID) | ORAL | Status: DC
  Administered 2022-03-04 – 2022-03-05 (×2): 325 mg via ORAL
  Filled 2022-03-04 (×2): qty 1

## 2022-03-04 MED ORDER — TRANEXAMIC ACID-NACL 1000-0.7 MG/100ML-% IV SOLN: 1000.0000 mg | INTRAVENOUS | Status: DC

## 2022-03-04 MED ORDER — SIMETHICONE 80 MG PO CHEW: 80.0000 mg | CHEWABLE_TABLET | ORAL | Status: DC | PRN

## 2022-03-04 MED ORDER — IBUPROFEN 600 MG PO TABS
600.0000 mg | ORAL_TABLET | Freq: Four times a day (QID) | ORAL | Status: DC
  Administered 2022-03-04 – 2022-03-06 (×8): 600 mg via ORAL
  Filled 2022-03-04 (×8): qty 1

## 2022-03-04 MED ORDER — ACETAMINOPHEN 325 MG PO TABS
650.0000 mg | ORAL_TABLET | ORAL | Status: DC | PRN
  Administered 2022-03-05 – 2022-03-06 (×2): 650 mg via ORAL
  Filled 2022-03-04 (×2): qty 2

## 2022-03-04 MED ORDER — FENTANYL-BUPIVACAINE-NACL 0.5-0.125-0.9 MG/250ML-% EP SOLN
EPIDURAL | Status: AC
  Filled 2022-03-04: qty 250

## 2022-03-04 MED ORDER — MISOPROSTOL 200 MCG PO TABS
800.0000 ug | ORAL_TABLET | Freq: Once | ORAL | Status: AC
Start: 2022-03-04 — End: 2022-03-04
  Administered 2022-03-04: 800 ug via RECTAL

## 2022-03-04 MED ORDER — DIBUCAINE (PERIANAL) 1 % EX OINT: 1.0000 | TOPICAL_OINTMENT | CUTANEOUS | Status: DC | PRN

## 2022-03-04 NOTE — Progress Notes (Signed)
Labor Progress Note Paz Mchaney is a 21 y.o. G1P0 at [redacted]w[redacted]d presented for eIOL.   S: Continuing to have late decels unassociated with contractions.   O:  BP 137/78   Pulse 100   Temp 98.8 F (37.1 C) (Oral)   Resp 18   Ht 5\' 6"  (1.676 m)   Wt 84.1 kg   LMP 05/31/2021 (Exact Date)   SpO2 100%   BMI 29.94 kg/m  EFM: 145bpm/moderate/+accels, variable decels, now with early decels  CVE: Dilation: 6 Effacement (%): 80 Cervical Position: Middle Station: -2 Presentation: Vertex Exam by:: [email or web address]   A&P: 21 y.o. G1P0 [redacted]w[redacted]d here for eIOL.  #Labor: s/p cytotec x4, FB, SROM, terb x1. IUPC placed and amnio infusion started. Progressing, consider pitocin after some time due to variables. Now having early decels #Pain: Epidural #FWB: Cat II, terb x1, amnio infusion, position change #GBS positive  Yukio Bisping Autry-Lott, DO 5:21 AM

## 2022-03-04 NOTE — Progress Notes (Signed)
Jada off suction x 1 hour with cervical balloon deflated for 30 minutes.  Uterus remains firm.  There was no excessive bleeding from the vagina with the balloon deflated.  Device removed in sterile fashion with no bleeding noted afterwards.  Fundal massage completed with normal lochia and uterus remained firm.  H/H check with slight decrease to 10.6.  Believe true value will be lower.  Recheck in AM.  Pt to [facility] specialty care for her postpartum care.   Mariel Aloe, MD Faculty attending , Center for [employer].

## 2022-03-04 NOTE — Anesthesia Postprocedure Evaluation (Signed)
Anesthesia Post Note  Patient: Rachael Sanchez  Procedure(s) Performed: AN AD HOC LABOR EPIDURAL     Patient location during evaluation: Mother Baby Anesthesia Type: Epidural Level of consciousness: awake and alert Pain management: pain level controlled Vital Signs Assessment: post-procedure vital signs reviewed and stable Respiratory status: spontaneous breathing, nonlabored ventilation and respiratory function stable Cardiovascular status: stable Postop Assessment: no headache, no backache and epidural receding Anesthetic complications: no   No notable events documented.  Last Vitals:  Vitals:   03/04/22 1609 03/04/22 1924  BP: 134/79 125/72  Pulse: (!) 106 (!) 111  Resp: 18 18  Temp: 37 C 37.1 C  SpO2: 99% 100%    Last Pain:  Vitals:   03/04/22 1930  TempSrc:   PainSc: 0-No pain   Pain Goal:                   [employer]

## 2022-03-04 NOTE — Progress Notes (Signed)
Labor Progress Note Rachael Sanchez is a 21 y.o. G1P0 at [redacted]w[redacted]d presented for eIOL.   S: Resting, no concerns.   O:  BP 123/72   Pulse 93   Temp 98.4 F (36.9 C) (Oral)   Resp 18   Ht 5\' 6"  (1.676 m)   Wt 84.1 kg   LMP 05/31/2021 (Exact Date)   BMI 29.94 kg/m  EFM: 135bpm/moderate/+accels, no decels  CVE: Dilation: 2 Effacement (%): 60 Cervical Position: Middle Station: -3 Presentation: Vertex Exam by:: Autry-Lott, DO   A&P: 21 y.o. G1P0 [redacted]w[redacted]d here for eIOL.  #Labor: s/p cytotec x2 without significant cervical change. FB placed, give another dose of cytotec. Consider Pit when FB out.  #Pain: Maternally supported, planning for epidural #FWB: Cat I #GBS positive  Thursa Emme Autry-Lott, DO 2:23 AM

## 2022-03-04 NOTE — Anesthesia Preprocedure Evaluation (Signed)
Anesthesia Evaluation  Patient identified by MRN, date of birth, ID band Patient awake    Reviewed: Allergy & Precautions, NPO status , Patient's Chart, lab work & pertinent test results  Airway Mallampati: II  TM Distance: >3 FB Neck ROM: Full    Dental no notable dental hx.    Pulmonary neg pulmonary ROS, Patient abstained from smoking., former smoker,    Pulmonary exam normal        Cardiovascular negative cardio ROS   Rhythm:Regular Rate:Normal     Neuro/Psych  Headaches, negative psych ROS   GI/Hepatic negative GI ROS, Neg liver ROS,   Endo/Other  negative endocrine ROS  Renal/GU negative Renal ROS  negative genitourinary   Musculoskeletal negative musculoskeletal ROS (+)   Abdominal Normal abdominal exam  (+)   Peds  Hematology  (+) Blood dyscrasia, anemia ,   Anesthesia Other Findings   Reproductive/Obstetrics (+) Pregnancy                             Anesthesia Physical Anesthesia Plan  ASA: 2  Anesthesia Plan: Epidural   Post-op Pain Management:    Induction:   PONV Risk Score and Plan: 2 and Treatment may vary due to age or medical condition  Airway Management Planned: Natural Airway  Additional Equipment: None  Intra-op Plan:   Post-operative Plan:   Informed Consent: I have reviewed the patients History and Physical, chart, labs and discussed the procedure including the risks, benefits and alternatives for the proposed anesthesia with the patient or authorized representative who has indicated his/her understanding and acceptance.     Dental advisory given  Plan Discussed with:   Anesthesia Plan Comments: (Lab Results      Component                Value               Date                      WBC                      8.0                 03/03/2022                HGB                      11.9 (L)            03/03/2022                HCT                       37.9                03/03/2022                MCV                      73.6 (L)            03/03/2022                PLT                      149 (L)  03/03/2022          )        Anesthesia Quick Evaluation

## 2022-03-04 NOTE — Progress Notes (Signed)
Labor Progress Note Whittney Slider is a 21 y.o. G1P0 at [redacted]w[redacted]d presented for eIOL.   S: Called by nurse due to repetitive late variable. Otherwise the patient does not have a concern.   O:  BP (!) 120/58   Pulse 71   Temp 97.8 F (36.6 C) (Oral)   Resp 16   Ht 5\' 6"  (1.676 m)   Wt 84.1 kg   LMP 05/31/2021 (Exact Date)   SpO2 100%   BMI 29.94 kg/m  EFM: 150bpm/moderate/+accels, variable decels  CVE: Dilation: 5 Effacement (%): 70 Cervical Position: Middle Station: -2 Presentation: Vertex Exam by:: [email or web address], RN   A&P: 21 y.o. G1P0 [redacted]w[redacted]d here for eIOL.  #Labor: s/p cytotec x4, FB, SROM. Progressing, consider pitocin after some time due to variables #Pain: Maternally supported, planning for epidural #FWB: Cat II, fluid bolus, maternal positioning  #GBS positive  Jovanny Stephanie Autry-Lott, DO 4:32 AM

## 2022-03-04 NOTE — Discharge Summary (Signed)
Postpartum Discharge Summary     Patient Name: Rachael Sanchez DOB: 2001/02/24 MRN: [id number]  Date of admission: 03/03/2022 Delivery date:03/04/2022  Delivering provider: Gerlene Fee  Date of discharge: 03/06/2022  Admitting diagnosis: Encounter for elective induction of labor [Z34.90] Intrauterine pregnancy: [redacted]w[redacted]d    Secondary diagnosis:  Principal Problem:   Encounter for elective induction of labor Active Problems:   Vaginal delivery      Discharge diagnosis: Term Pregnancy Delivered and PPH                                              Augmentation: Cytotec, IP Foley, and IUPC, amnio Complications: [id number] Hospital course: Induction of Labor With Vaginal Delivery   21y.o. yo G1P1001 at 332w4das admitted to the hospital 03/03/2022 for induction of labor.  Indication for induction: Elective.  Patient had an uncomplicated labor course as follows: Membrane Rupture Time/Date: 3:05 AM ,03/04/2022   Delivery Method:Vaginal, Spontaneous  Episiotomy: None  Lacerations:  2nd degree  Details of delivery can be found in separate delivery note.  Patient had a routine postpartum course. Patient is discharged home 03/06/22.  Newborn Data: Birth date:03/04/2022  Birth time:7:36 AM  Gender:Female  Living status:Living  Apgars:9 ,9  Weight:3640 g   Magnesium Sulfate received: No BMZ received: No Rhophylac:No MMR:No T-DaP:Given prenatally Flu: N/A Transfusion:No  Physical exam  Vitals:   03/05/22 1610 03/05/22 2029 03/05/22 2341 03/06/22 0528  BP: 128/77 126/60 122/67 (!) 112/57  Pulse: 88 100 90   Resp: _0 Temp: 98.3 F (36.8 C) 98.5 F (36.9 C) 97.8 F (36.6 C) 97.6 F (36.4 C)  TempSrc: Oral Oral Oral Oral  SpO2: 100% 100% 100% 100%  Weight:      Height:       General: alert, cooperative, and no distress Lochia: appropriate Uterine Fundus: firm DVT Evaluation: No evidence of DVT seen on physical exam. Labs: Lab Results   Component Value Date   WBC 14.3 (H) 03/05/2022   HGB 9.2 (L) 03/05/2022   HCT 28.3 (L) 03/05/2022   MCV 73.1 (L) 03/05/2022   PLT 144 (L) 03/05/2022      Latest Ref Rng & Units 09/09/2021   12:45 PM  CMP  Glucose 70 - 99 mg/dL 101   BUN 6 - 20 mg/dL <5   Creatinine 0.44 - 1.00 mg/dL 0.47   Sodium 135 - 145 mmol/L 135   Potassium 3.5 - 5.1 mmol/L 3.8   Chloride 98 - 111 mmol/L 105   CO2 22 - 32 mmol/L 23   Calcium 8.9 - 10.3 mg/dL 9.2   Total Protein 6.5 - 8.1 g/dL 7.1   Total Bilirubin 0.3 - 1.2 mg/dL 0.2   Alkaline Phos 38 - 126 U/L 52   AST 15 - 41 U/L 16   ALT 0 - 44 U/L 10    Edinburgh Score:     No data to display           After visit meds:  Allergies as of 03/06/2022   No Known Allergies      Medication List     STOP taking these medications    aspirin EC 81 MG tablet       TAKE these medications    cyclobenzaprine 10 MG tablet Commonly known as: FLEXERIL Take 1 tablet (10 mg  total) by mouth 3 (three) times daily as needed (headaches).   ferrous sulfate 325 (65 FE) MG tablet Take 1 tablet (325 mg total) by mouth every other day. Start taking on: March 07, 2022   ibuprofen 600 MG tablet Commonly known as: ADVIL Take 1 tablet (600 mg total) by mouth every 6 (six) hours.   prenatal vitamin w/FE, FA 27-1 MG Tabs tablet Take 1 tablet by mouth daily at 12 noon.         Discharge home in stable condition Infant Feeding: Bottle and Breast Infant Disposition:home with mother Discharge instruction: per After Visit Summary and Postpartum booklet. Activity: Advance as tolerated. Pelvic rest for 6 weeks.  Diet: routine diet Future Appointments: Future Appointments  Date Time Provider [place]  03/19/2022  2:45 PM [facility]  04/07/2022  4:35 PM Clarnce Flock, MD [facility]   Follow up Visit:  [place] [facility] [place] [facility] Follow  up on 04/07/2022.   Specialty: Obstetrics and Gynecology Contact information: [place] [address] [phone]                Message sent to [facility] on 8/16 by Naaman Plummer Autry-Lott  Please schedule this patient for a In person postpartum visit in 4 weeks with the following provider: MD and APP. Additional Postpartum F/U: 2nd degree perineal laceration   Low risk pregnancy complicated by:  PPH Delivery mode:  Vaginal, Spontaneous  Anticipated Birth Control:  Depo   03/06/2022 Mora Bellman, MD

## 2022-03-04 NOTE — Anesthesia Postprocedure Evaluation (Signed)
Anesthesia Post Note  Patient: Rachael Sanchez  Procedure(s) Performed: AN AD HOC LABOR EPIDURAL     Patient location during evaluation: Mother Baby Anesthesia Type: Epidural Level of consciousness: awake and alert Pain management: pain level controlled Vital Signs Assessment: post-procedure vital signs reviewed and stable Respiratory status: spontaneous breathing, nonlabored ventilation and respiratory function stable Cardiovascular status: stable Postop Assessment: no headache, no backache and epidural receding Anesthetic complications: no   No notable events documented.  Last Vitals:  Vitals:   03/04/22 1609 03/04/22 1924  BP: 134/79 125/72  Pulse: (!) 106 (!) 111  Resp: 18 18  Temp: 37 C 37.1 C  SpO2: 99% 100%    Last Pain:  Vitals:   03/04/22 1930  TempSrc:   PainSc: 0-No pain   Pain Goal:                   [name]     

## 2022-03-04 NOTE — Progress Notes (Addendum)
Rachael Sanchez is PPD 0 after SVD at 0736. She had a PPH requiring TXA, cytotec, IM methergine and JADA placement at ~0830. JADA suction was discontinued at ~10am. On recheck at ~11am, the balloon of the JADA was still filled. The plan is to await 30 min with balloon deflated and the suction off. If pt bleeding has stopped, JADA can be removed.   Myrtie Hawk, DO   Update:   JADA removed after no bleeding noted around ~1130am. Will have AM CBC and continue pp routine care.

## 2022-03-04 NOTE — Anesthesia Procedure Notes (Signed)
Epidural Patient location during procedure: OB Start time: 03/04/2022 3:40 AM End time: 03/04/2022 3:49 AM  Staffing Anesthesiologist: Atilano Median, DO Performed: anesthesiologist   Preanesthetic Checklist Completed: patient identified, IV checked, site marked, risks and benefits discussed, surgical consent, monitors and equipment checked, pre-op evaluation and timeout performed  Epidural Patient position: sitting Prep: ChloraPrep Patient monitoring: heart rate, continuous pulse ox and blood pressure Approach: midline Location: L4-L5 Injection technique: LOR saline  Needle:  Needle type: Tuohy  Needle gauge: 17 G Needle length: 9 cm Needle insertion depth: 8 cm Catheter type: closed end flexible Catheter size: 20 Guage Catheter at skin depth: 12 cm Test dose: negative and 1.5% lidocaine with Epi 1:200 K  Assessment Events: blood not aspirated, injection not painful, no injection resistance and no paresthesia  Additional Notes  Patient identified. Risks/Benefits/Options discussed with patient including but not limited to bleeding, infection, nerve damage, paralysis, failed block, incomplete pain control, headache, blood pressure changes, nausea, vomiting, reactions to medications, itching and postpartum back pain. Confirmed with bedside nurse the patient's most recent platelet count. Confirmed with patient that they are not currently taking any anticoagulation, have any bleeding history or any family history of bleeding disorders. Patient expressed understanding and wished to proceed. All questions were answered. Sterile technique was used throughout the entire procedure. Please see nursing notes for vital signs. Test dose was given through epidural catheter and negative prior to continuing to dose epidural or start infusion. Warning signs of high block given to the patient including shortness of breath, tingling/numbness in hands, complete motor block, or any concerning  symptoms with instructions to call for help. Patient was given instructions on fall risk and not to get out of bed. All questions and concerns addressed with instructions to call with any issues or inadequate analgesia.    Reason for block:procedure for pain

## 2022-03-05 LAB — CBC
HCT: 28.3 % — ABNORMAL LOW (ref 36.0–46.0)
Hemoglobin: 9.2 g/dL — ABNORMAL LOW (ref 12.0–15.0)
MCH: 23.8 pg — ABNORMAL LOW (ref 26.0–34.0)
MCHC: 32.5 g/dL (ref 30.0–36.0)
MCV: 73.1 fL — ABNORMAL LOW (ref 80.0–100.0)
Platelets: 144 10*3/uL — ABNORMAL LOW (ref 150–400)
RBC: 3.87 MIL/uL (ref 3.87–5.11)
RDW: 15.9 % — ABNORMAL HIGH (ref 11.5–15.5)
WBC: 14.3 10*3/uL — ABNORMAL HIGH (ref 4.0–10.5)
nRBC: 0 % (ref 0.0–0.2)

## 2022-03-05 MED ORDER — FERROUS SULFATE 325 (65 FE) MG PO TABS: 325.0000 mg | ORAL_TABLET | ORAL | Status: DC

## 2022-03-05 NOTE — Progress Notes (Signed)
Post Partum Day 1  Subjective: Doing well. No acute events overnight. Pain is controlled and bleeding is appropriate. She is eating, drinking, voiding, and ambulating without issue. She reprots no acute concerns or complaints  Objective: Blood pressure 115/67, pulse 88, temperature 98.5 F (36.9 C), temperature source Oral, resp. rate 18, height 5\' 6"  (1.676 m), weight 84.1 kg, last menstrual period 05/31/2021, SpO2 99 %, unknown if currently breastfeeding.  Physical Exam:  General: alert, cooperative, and no distress CV: RRR Lungs: CTAB Lochia: appropriate Uterine Fundus: firm, below umbilicus DVT Evaluation: No evidence of DVT seen on physical exam.  Recent Labs    03/04/22 0938 03/05/22 0507  HGB 10.6* 9.2*  HCT 33.4* 28.3*    Assessment/Plan: Rachael Sanchez is a 21 y.o. s/p NSVD on PPD# 1 complicated by: -PPH- EBL 1350cc, s/p JADA, cytotec, methergine.  Bleeding appropriate. VSS.  Hgb appropriate for recent hemorrhage. Will continue with oral iron supplementation  Progressing well. Meeting postpartum milestones.  Continue routine postpartum care.  Contraception: Depoot   Dispo: Plan for discharge home tomorrow   LOS: 2 days   21, DO  03/05/2022, 9:44 AM

## 2022-03-05 NOTE — Progress Notes (Signed)
CSW acknowledged consult for late [facility] however MOB initiated care at 17 weeks.  [redacted] weeks gestation does not warrants for CSW involvement. However, CSW did meet MOB to acknowledged MOB's hx of anx/dep.  MOB communicated she was dx in middle school and has had little to no symptoms since high school.  MOB declined any information and resources for PMADs.   CSW updated RN.   Blaine Hamper, MSW, LCSW Clinical Social Work [phone]

## 2022-03-05 NOTE — Lactation Note (Signed)
This note was copied from a baby's chart. Lactation Consultation Note  Patient Name: Rachael Sanchez HTDSK'A Date: 03/05/2022   Age:21 hours As per RN, Marella Chimes, formula feeding only. LC completed her from [facility] services.   Maternal Data    Feeding Nipple Type: Slow - flow  LATCH Score                    Lactation Tools Discussed/Used    Interventions    Discharge    Consult Status      Alba Kriesel  Nicholson-Springer 03/05/2022, 1:38 PM

## 2022-03-06 MED ORDER — FERROUS SULFATE 325 (65 FE) MG PO TABS: 325.0000 mg | ORAL_TABLET | ORAL | 1 refills | Status: DC

## 2022-03-06 MED ORDER — IBUPROFEN 600 MG PO TABS: 600.0000 mg | ORAL_TABLET | Freq: Four times a day (QID) | ORAL | 0 refills | Status: DC

## 2022-03-06 MED ORDER — CYCLOBENZAPRINE HCL 10 MG PO TABS
10.0000 mg | ORAL_TABLET | Freq: Three times a day (TID) | ORAL | Status: DC | PRN
  Administered 2022-03-06: 10 mg via ORAL
  Filled 2022-03-06: qty 1

## 2022-03-06 NOTE — Progress Notes (Signed)
Erroneous encounter

## 2022-03-06 NOTE — Progress Notes (Signed)
Discharge instructions and prescriptions given to pt. Discussed post vaginal delivery care, signs and symptoms to report to the MD, upcoming appointments, and meds. Pt verbalizes understanding and has no questions or concerns at this time. Pt discharged home from hospital with baby in stable condition. 

## 2022-03-07 LAB — TYPE AND SCREEN
ABO/RH(D): O POS
Antibody Screen: POSITIVE
Unit division: 0
Unit division: 0

## 2022-03-07 LAB — BPAM RBC
Blood Product Expiration Date: 202309172359
Blood Product Expiration Date: 202309172359
Unit Type and Rh: 5100
Unit Type and Rh: 5100

## 2022-03-09 ENCOUNTER — Other Ambulatory Visit: Payer: Self-pay

## 2022-03-09 ENCOUNTER — Encounter: Payer: Self-pay | Admitting: Family Medicine

## 2022-03-09 NOTE — BH Specialist Note (Unsigned)
Pt did not arrive to video visit and did not answer the phone; Unable to leave voice message; left MyChart message for patient.   

## 2022-03-19 ENCOUNTER — Ambulatory Visit: Payer: Medicaid Other | Admitting: Clinical

## 2022-03-19 DIAGNOSIS — Z91199 Patient's noncompliance with other medical treatment and regimen due to unspecified reason: Secondary | ICD-10-CM

## 2022-04-06 NOTE — Progress Notes (Unsigned)
[place] Partum Visit Note  Rachael Sanchez is a 21 y.o. G82P1001 female who presents for a postpartum visit. She is 4 weeks postpartum following a normal spontaneous vaginal delivery.  I have fully reviewed the prenatal and intrapartum course. The delivery was at 39.4 gestational weeks.  Anesthesia: epidural. Postpartum course has been uneventful. Baby is doing well. Baby is feeding by both breast and bottle - Similac Sensitive RS. Bleeding staining only. Bowel function is normal. Bladder function is normal. Patient is sexually active. Contraception method is Depo-Provera injections. Postpartum depression screening: negative.   The pregnancy intention screening data noted above was reviewed. Potential methods of contraception were discussed. The patient elected to proceed with No data recorded.   Edinburgh Postnatal Depression Scale - 04/07/22 1619       Edinburgh Postnatal Depression Scale:  In the Past 7 Days   I have been able to laugh and see the funny side of things. 0    I have looked forward with enjoyment to things. 0    I have blamed myself unnecessarily when things went wrong. 0    I have been anxious or worried for no good reason. 2    I have felt scared or panicky for no good reason. 0    Things have been getting on top of me. 0    I have been so unhappy that I have had difficulty sleeping. 0    I have felt sad or miserable. 0    I have been so unhappy that I have been crying. 0    The thought of harming myself has occurred to me. 0    Edinburgh Postnatal Depression Scale Total 2             There are no preventive care reminders to display for this patient.   The following portions of the patient's history were reviewed and updated as appropriate: allergies, current medications, past family history, past medical history, past social history, past surgical history, and problem list.  Review of Systems Pertinent items noted in HPI and remainder of comprehensive ROS  otherwise negative.  Objective:  BP 129/88   Pulse 86   Wt 163 lb 9.6 oz (74.2 kg)   BMI 26.41 kg/m    General:  alert, cooperative, and appears stated age   Breasts:  not indicated  Lungs: Comfortalbe on room air  Wound N/a  GU exam:  normal        Assessment:    There are no diagnoses linked to this encounter.  Normal postpartum exam.   Plan:   Essential components of care per ACOG recommendations:  1.  Mood and well being: Patient with negative depression screening today. Reviewed local resources for support.  - Patient tobacco use? No.   - hx of drug use? No.    2. Infant care and feeding:  -Patient currently breastmilk feeding? Yes. Reviewed importance of draining breast regularly to support lactation.  -Social determinants of health (SDOH) reviewed in EPIC. The following needs were identified: food insecurity, offered food market  3. Sexuality, contraception and birth spacing - Patient does not want a pregnancy in the next year.  Desired family size is 1 children.  - Reviewed reproductive life planning. Reviewed contraceptive methods based on pt preferences and effectiveness.  Patient desired Hormonal Injection today.   - Discussed birth spacing of 18 months  4. Sleep and fatigue -Encouraged family/partner/community support of 4 hrs of uninterrupted sleep to help with mood and  fatigue  5. Physical Recovery  - Discussed patients delivery and complications. She describes her labor as good. - Patient had a Vaginal problems after delivery including hemorrhage . Patient had a 2nd degree laceration. Perineal healing reviewed. Patient expressed understanding - Patient has urinary incontinence? No. - Patient is safe to resume physical and sexual activity  6.  Health Maintenance - HM due items addressed No - up to date - Last pap smear  Diagnosis  Date Value Ref Range Status  09/29/2021      - Negative for intraepithelial lesion or malignancy (NILM)   Pap smear not  done at today's visit.  -Breast Cancer screening indicated? No.   7. Chronic Disease/Pregnancy Condition follow up: None  - PCP follow up  Venora Maples, MD Center for [facility], [facility]

## 2022-04-07 ENCOUNTER — Ambulatory Visit (INDEPENDENT_AMBULATORY_CARE_PROVIDER_SITE_OTHER): Payer: Medicaid Other | Admitting: Family Medicine

## 2022-04-07 ENCOUNTER — Encounter: Payer: Self-pay | Admitting: Family Medicine

## 2022-04-07 DIAGNOSIS — Z3042 Encounter for surveillance of injectable contraceptive: Secondary | ICD-10-CM | POA: Diagnosis not present

## 2022-04-07 MED ORDER — MEDROXYPROGESTERONE ACETATE 150 MG/ML IM SUSP
150.0000 mg | Freq: Once | INTRAMUSCULAR | Status: AC
  Administered 2022-04-07: 150 mg via INTRAMUSCULAR

## 2022-04-20 ENCOUNTER — Telehealth: Payer: Self-pay | Admitting: Family Medicine

## 2022-04-20 NOTE — Telephone Encounter (Signed)
Patients employer called in to confirm the patient can return to work with no restrictions.

## 2022-04-20 NOTE — Telephone Encounter (Signed)
Called and spoke to [place] at [employer] for pt's employer. Spoke with [employer]. States needing note to return to work note. Mychart letter sent. Call placed to pt. Spoke with pt. Pt states returning to work 04/27/2022. Colletta [place], RNC

## 2022-04-23 ENCOUNTER — Encounter: Payer: Self-pay | Admitting: Family Medicine

## 2022-04-23 DIAGNOSIS — B369 Superficial mycosis, unspecified: Secondary | ICD-10-CM

## 2022-04-24 MED ORDER — CLOTRIMAZOLE 1 % EX CREA: 1.0000 | TOPICAL_CREAM | Freq: Two times a day (BID) | CUTANEOUS | 0 refills | Status: DC

## 2022-05-05 ENCOUNTER — Other Ambulatory Visit: Payer: Self-pay | Admitting: Family Medicine

## 2022-05-05 DIAGNOSIS — L309 Dermatitis, unspecified: Secondary | ICD-10-CM

## 2022-05-05 MED ORDER — TRIAMCINOLONE ACETONIDE 0.1 % EX CREA: 1.0000 | TOPICAL_CREAM | Freq: Two times a day (BID) | CUTANEOUS | 0 refills | Status: AC

## 2022-05-05 NOTE — Progress Notes (Signed)
At daughter's visit noted to have ongoing dryness and scaling in interdigital space of R hand. Has trialed fungal cream without effect. Trial kenalog with vaseline, advised not to use for more than two weeks to avoid pigmentation changes. If not effective will need derm referral.

## 2022-06-23 ENCOUNTER — Ambulatory Visit (INDEPENDENT_AMBULATORY_CARE_PROVIDER_SITE_OTHER): Payer: Medicaid Other

## 2022-06-23 ENCOUNTER — Other Ambulatory Visit: Payer: Self-pay

## 2022-06-23 VITALS — BP 130/81 | HR 96 | Wt 161.1 lb

## 2022-06-23 DIAGNOSIS — Z3042 Encounter for surveillance of injectable contraceptive: Secondary | ICD-10-CM | POA: Diagnosis not present

## 2022-06-23 MED ORDER — MEDROXYPROGESTERONE ACETATE 150 MG/ML IM SUSP
150.0000 mg | Freq: Once | INTRAMUSCULAR | Status: AC
  Administered 2022-06-23: 150 mg via INTRAMUSCULAR

## 2022-06-23 NOTE — Progress Notes (Signed)
Rachael Sanchez here for Depo-Provera Injection. Injection administered without complication. Patient will return in 3 months for next injection between 09/08/22 and 09/23/22. Next annual visit due September 2024.   Marjo Bicker, RN 06/23/2022  8:24 AM

## 2022-07-31 ENCOUNTER — Other Ambulatory Visit (HOSPITAL_COMMUNITY)
Admission: RE | Admit: 2022-07-31 | Discharge: 2022-07-31 | Disposition: A | Discharge status: Home / Self Care | Payer: Medicaid Other | Source: Ambulatory Visit | Attending: Family Medicine | Admitting: Family Medicine

## 2022-07-31 ENCOUNTER — Other Ambulatory Visit: Payer: Self-pay

## 2022-07-31 ENCOUNTER — Ambulatory Visit: Payer: Medicaid Other

## 2022-07-31 VITALS — BP 124/73 | HR 88 | Temp 97.1°F | Ht 66.0 in | Wt 167.0 lb

## 2022-07-31 DIAGNOSIS — Z113 Encounter for screening for infections with a predominantly sexual mode of transmission: Secondary | ICD-10-CM | POA: Diagnosis present

## 2022-07-31 MED ORDER — NORGESTIMATE-ETH ESTRADIOL 0.25-35 MG-MCG PO TABS: 1.0000 | ORAL_TABLET | Freq: Every day | ORAL | 1 refills | Status: DC

## 2022-08-03 ENCOUNTER — Encounter: Payer: Self-pay | Admitting: Family Medicine

## 2022-08-03 NOTE — Progress Notes (Signed)
Pt here today for yearly STD testing. Pt denies having any symptoms. Pt states having intercourse with same partner. Denies the need to have any STD blood work today.  Pt to perform self swab today.  Self swab collected today. Pt advised results will take 24-48 hours and will see results in mychart and will be notified if needs further treatment.  Pt verbalized understanding.   Pt also states still having vaginal bleeding/spotting with Depo injection. Spoke with Dr Dione Plover. Dr Dione Plover advised to take OCPs for 1 month to see if that helps with bleeding and will have an update in 1 month at childs [facility] in Feb. Pt agreeable to plan of care. Rx sent to pharmacy on file.  Colletta Maryland, RNC

## 2022-08-04 LAB — CERVICOVAGINAL ANCILLARY ONLY
Bacterial Vaginitis (gardnerella): POSITIVE — AB
Candida Glabrata: NEGATIVE
Candida Vaginitis: NEGATIVE
Chlamydia: NEGATIVE
Comment: NEGATIVE
Comment: NEGATIVE
Comment: NEGATIVE
Comment: NEGATIVE
Comment: NEGATIVE
Comment: NORMAL
Neisseria Gonorrhea: NEGATIVE
Trichomonas: NEGATIVE

## 2022-08-04 MED ORDER — METRONIDAZOLE 500 MG PO TABS: 500.0000 mg | ORAL_TABLET | Freq: Two times a day (BID) | ORAL | 0 refills | Status: AC

## 2022-08-04 NOTE — Addendum Note (Signed)
Addended by: Clayton Lefort on: 08/04/2022 01:14 PM   Modules accepted: Orders

## 2022-09-03 ENCOUNTER — Encounter: Payer: Self-pay | Admitting: Family Medicine

## 2022-09-08 ENCOUNTER — Other Ambulatory Visit: Payer: Self-pay

## 2022-09-08 ENCOUNTER — Encounter: Payer: Self-pay | Admitting: Family Medicine

## 2022-09-08 ENCOUNTER — Encounter: Payer: Medicaid Other | Admitting: Family Medicine

## 2022-09-08 ENCOUNTER — Ambulatory Visit: Payer: Medicaid Other

## 2022-09-08 ENCOUNTER — Ambulatory Visit (INDEPENDENT_AMBULATORY_CARE_PROVIDER_SITE_OTHER): Payer: Medicaid Other | Admitting: Family Medicine

## 2022-09-08 VITALS — BP 111/67 | HR 74 | Wt 167.0 lb

## 2022-09-08 DIAGNOSIS — Z975 Presence of (intrauterine) contraceptive device: Secondary | ICD-10-CM

## 2022-09-08 DIAGNOSIS — Z30430 Encounter for insertion of intrauterine contraceptive device: Secondary | ICD-10-CM | POA: Diagnosis not present

## 2022-09-08 MED ORDER — LEVONORGESTREL 20.1 MCG/DAY IU IUD
1.0000 | INTRAUTERINE_SYSTEM | Freq: Once | INTRAUTERINE | Status: AC
  Administered 2022-09-08: 1 via INTRAUTERINE

## 2022-09-08 NOTE — Addendum Note (Signed)
Addended by: Jake Seats on: 09/08/2022 02:51 PM   Modules accepted: Orders

## 2022-09-08 NOTE — Progress Notes (Signed)
    GYNECOLOGY OFFICE PROCEDURE NOTE  Rachael Sanchez is a 22 y.o. G1P1001 here for [place] IUD insertion. No GYN concerns.  Last pap smear:  Lab Results  Component Value Date   DIAGPAP  09/29/2021    - Negative for intraepithelial lesion or malignancy (NILM)    Urine pregnancy test: n/a, still within depo window  IUD Insertion Procedure Note Patient identified, informed consent performed, consent signed.   Discussed risks of irregular bleeding, increased cramping, infection, malpositioning or misplacement of the IUD outside the uterus which may require further procedure such as laparoscopy. Also discussed >99% contraception efficacy, increased risk of ectopic pregnancy with failure of method.  Time out was performed.  Speculum placed in the vagina.  Cervix visualized.  Cleaned with Betadine x 2.  Uterus sounded to 9 cm. IUD placed per manufacturer's recommendations.  Strings trimmed to 3 cm. Tenaculum was removed, good hemostasis noted.  Patient tolerated procedure well.   Patient was given post-procedure instructions.  She was advised to have backup contraception for one week.  Patient was also asked to check IUD strings periodically and follow up in 4 weeks for IUD check.  Clarnce Flock, MD/MPH Attending Family Medicine Physician, [facility] for [facility], [place]

## 2022-10-13 ENCOUNTER — Ambulatory Visit (INDEPENDENT_AMBULATORY_CARE_PROVIDER_SITE_OTHER): Payer: Medicaid Other | Admitting: Family Medicine

## 2022-10-13 ENCOUNTER — Encounter: Payer: Self-pay | Admitting: Family Medicine

## 2022-10-13 ENCOUNTER — Other Ambulatory Visit: Payer: Self-pay

## 2022-10-13 VITALS — BP 110/77 | HR 98 | Ht 66.0 in | Wt 166.8 lb

## 2022-10-13 DIAGNOSIS — Z975 Presence of (intrauterine) contraceptive device: Secondary | ICD-10-CM

## 2022-10-13 NOTE — Progress Notes (Signed)
   [facility] GYNECOLOGY OFFICE VISIT NOTE  History:   Rachael Sanchez is a 22 y.o. G1P1001 here today for IUD check.  Uncomplicated placement on 09/08/2022 Reports no issues Tried to feel strings but couldn't  There are no preventive care reminders to display for this patient.  Past Medical History:  Diagnosis Date   Anemia    Chlamydia 2016   Headache    Ovarian cyst    UTI (urinary tract infection)     Past Surgical History:  Procedure Laterality Date   NO PAST SURGERIES      The following portions of the patient's history were reviewed and updated as appropriate: allergies, current medications, past family history, past medical history, past social history, past surgical history and problem list.   Health Maintenance:   Last pap: Lab Results  Component Value Date   DIAGPAP  09/29/2021    - Negative for intraepithelial lesion or malignancy (NILM)    Last mammogram:  N/a    Review of Systems:  Pertinent items noted in HPI and remainder of comprehensive ROS otherwise negative.  Physical Exam:  BP 110/77   Pulse 98   Ht 5\' 6"  (1.676 m)   Wt 166 lb 12.8 oz (75.7 kg)   LMP 10/10/2022 (Exact Date) Comment: spotting  Breastfeeding No   BMI 26.92 kg/m  CONSTITUTIONAL: Well-developed, well-nourished female in no acute distress.  HEENT:  Normocephalic, atraumatic. External right and left ear normal. No scleral icterus.  NECK: Normal range of motion, supple, no masses noted on observation SKIN: No rash noted. Not diaphoretic. No erythema. No pallor. MUSCULOSKELETAL: Normal range of motion. No edema noted. NEUROLOGIC: Alert and oriented to person, place, and time. Normal muscle tone coordination.  PSYCHIATRIC: Normal mood and affect. Normal behavior. Normal judgment and thought content. RESPIRATORY: Effort normal, no problems with respiration noted PELVIC: Normal appearing external genitalia; normal appearing vaginal mucosa and cervix. IUD strings a  bit longer than my documented note of 3-4, more like 5-6 cm but inferior portion not visible  Labs and Imaging No results found for this or any previous visit (from the past 168 hour(s)). No results found.    Assessment and Plan:   Problem List Items Addressed This Visit       Other   IUD (intrauterine device) in place - Primary    IUD may be slightly low in position as evidenced by mild lengthening of strings but does not appear to be close to expulsion and will still function effectively for contraception.        Routine preventative health maintenance measures emphasized. Please refer to After Visit Summary for other counseling recommendations.   Return if symptoms worsen or fail to improve.    Total face-to-face time with patient: 10 minutes.  Over 50% of encounter was spent on counseling and coordination of care.   Clarnce Flock, MD/MPH Attending Family Medicine Physician, [facility] for [facility], [place]

## 2022-10-14 ENCOUNTER — Encounter: Payer: Self-pay | Admitting: Family Medicine

## 2022-10-14 NOTE — Assessment & Plan Note (Signed)
IUD may be slightly low in position as evidenced by mild lengthening of strings but does not appear to be close to expulsion and will still function effectively for contraception.

## 2022-12-03 ENCOUNTER — Encounter: Payer: Self-pay | Admitting: Family Medicine

## 2022-12-04 ENCOUNTER — Ambulatory Visit (INDEPENDENT_AMBULATORY_CARE_PROVIDER_SITE_OTHER): Payer: Medicaid Other

## 2022-12-04 ENCOUNTER — Other Ambulatory Visit: Payer: Self-pay

## 2022-12-04 ENCOUNTER — Other Ambulatory Visit (HOSPITAL_COMMUNITY)
Admission: RE | Admit: 2022-12-04 | Discharge: 2022-12-04 | Disposition: A | Discharge status: Home / Self Care | Payer: Medicaid Other | Source: Ambulatory Visit | Attending: Family Medicine | Admitting: Family Medicine

## 2022-12-04 VITALS — BP 117/81 | HR 102 | Wt 167.0 lb

## 2022-12-04 DIAGNOSIS — Z202 Contact with and (suspected) exposure to infections with a predominantly sexual mode of transmission: Secondary | ICD-10-CM | POA: Insufficient documentation

## 2022-12-04 NOTE — Progress Notes (Signed)
Rachael Sanchez is here with concern of STD testing, wanting to do self swab for testing and denies blood work. Patient denies having any symptoms.   Self swab collected today. Pt advised results will take 24-48 hours and will see results in mychart and will be notified if needs further treatment.  Pt verbalized understanding.   Meryl Crutch, RN 12/04/2022  9:40 AM

## 2022-12-08 LAB — CERVICOVAGINAL ANCILLARY ONLY
Chlamydia: NEGATIVE
Comment: NEGATIVE
Comment: NEGATIVE
Comment: NORMAL
Neisseria Gonorrhea: NEGATIVE
Trichomonas: NEGATIVE

## 2023-03-08 ENCOUNTER — Encounter: Payer: Self-pay | Admitting: Family Medicine

## 2023-06-30 ENCOUNTER — Encounter: Payer: Self-pay | Admitting: Family Medicine

## 2023-07-15 ENCOUNTER — Ambulatory Visit: Payer: Medicaid Other

## 2023-08-15 ENCOUNTER — Encounter: Payer: Self-pay | Admitting: Family Medicine

## 2023-09-06 ENCOUNTER — Other Ambulatory Visit (HOSPITAL_COMMUNITY)
Admission: RE | Admit: 2023-09-06 | Discharge: 2023-09-06 | Disposition: A | Discharge status: Home / Self Care | Payer: Medicaid Other | Source: Ambulatory Visit | Attending: Family Medicine | Admitting: Family Medicine

## 2023-09-06 ENCOUNTER — Encounter: Payer: Medicaid Other | Admitting: Family Medicine

## 2023-09-06 ENCOUNTER — Ambulatory Visit: Payer: Medicaid Other

## 2023-09-06 VITALS — BP 127/86 | HR 87

## 2023-09-06 DIAGNOSIS — Z113 Encounter for screening for infections with a predominantly sexual mode of transmission: Secondary | ICD-10-CM | POA: Diagnosis present

## 2023-09-06 NOTE — Progress Notes (Signed)
Pt here today for STD testing.  Pt requests that she does not have any other concerns and she would like to have STD blood panel as well.  Pt explained how to obtain self swab.  Labs drawn today.  Pt advised that we will call with abnormal results.   Pt verbalized understanding.   Leonette Nutting  09/06/23

## 2023-09-07 ENCOUNTER — Encounter: Payer: Self-pay | Admitting: Family Medicine

## 2023-09-07 ENCOUNTER — Other Ambulatory Visit: Payer: Self-pay | Admitting: Family Medicine

## 2023-09-07 LAB — RPR: RPR Ser Ql: NONREACTIVE

## 2023-09-07 LAB — CERVICOVAGINAL ANCILLARY ONLY
Bacterial Vaginitis (gardnerella): POSITIVE — AB
Candida Glabrata: NEGATIVE
Candida Vaginitis: NEGATIVE
Chlamydia: NEGATIVE
Comment: NEGATIVE
Comment: NEGATIVE
Comment: NEGATIVE
Comment: NEGATIVE
Comment: NEGATIVE
Comment: NORMAL
Neisseria Gonorrhea: NEGATIVE
Trichomonas: NEGATIVE

## 2023-09-07 LAB — HEPATITIS B SURFACE ANTIGEN: Hepatitis B Surface Ag: NEGATIVE

## 2023-09-07 LAB — HIV ANTIBODY (ROUTINE TESTING W REFLEX): HIV Screen 4th Generation wRfx: NONREACTIVE

## 2023-09-07 LAB — HEPATITIS C ANTIBODY: Hep C Virus Ab: NONREACTIVE

## 2023-09-07 MED ORDER — METRONIDAZOLE 500 MG PO TABS: 500.0000 mg | ORAL_TABLET | Freq: Two times a day (BID) | ORAL | 0 refills | Status: DC

## 2023-09-14 ENCOUNTER — Encounter: Payer: Self-pay | Admitting: Family Medicine

## 2023-09-14 IMAGING — US US OB COMP LESS 14 WK
1 series · 15 of 28 positions shown · non-contrast
Comparison: None.

CLINICAL DATA: Pregnant patient in first-trimester pregnancy with
lower abdominal pain and vaginal bleeding for 1 day. LMP 05/31/2021,
gestational age by LMP 7 weeks 2 days.

EXAM:
OBSTETRIC <14 WK ULTRASOUND
TECHNIQUE: Transabdominal ultrasound was performed for evaluation of the
gestation as well as the maternal uterus and adnexal regions.

[Series 1: us ob comp less 14 wk · 15 of 35 slices shown]
[im 1/35]
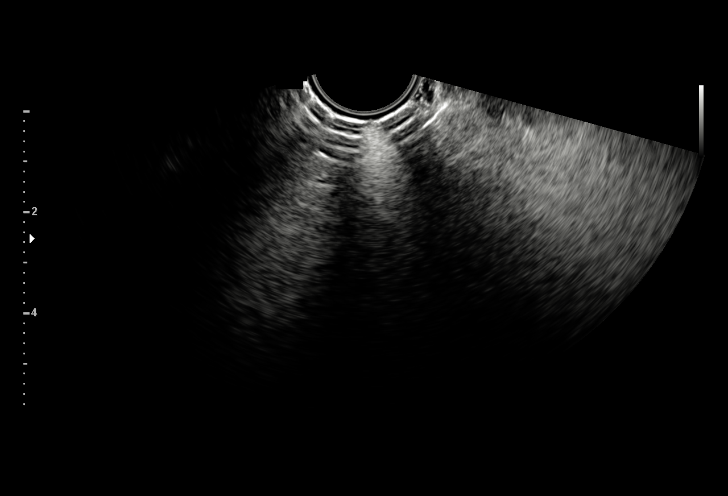
[im 3/35]
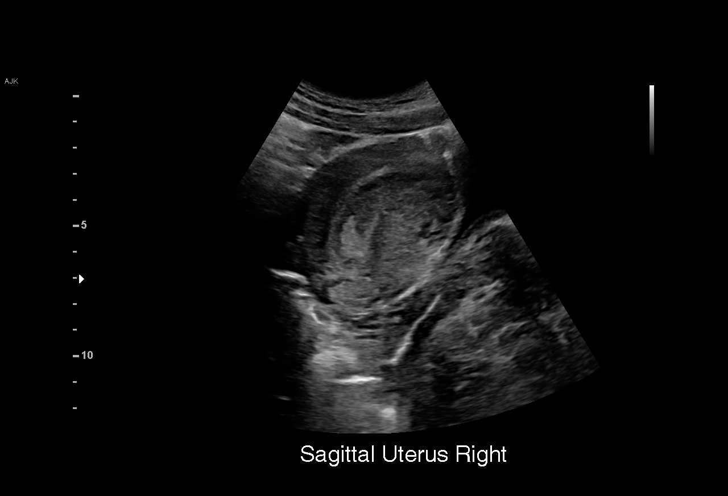
[im 6/35]
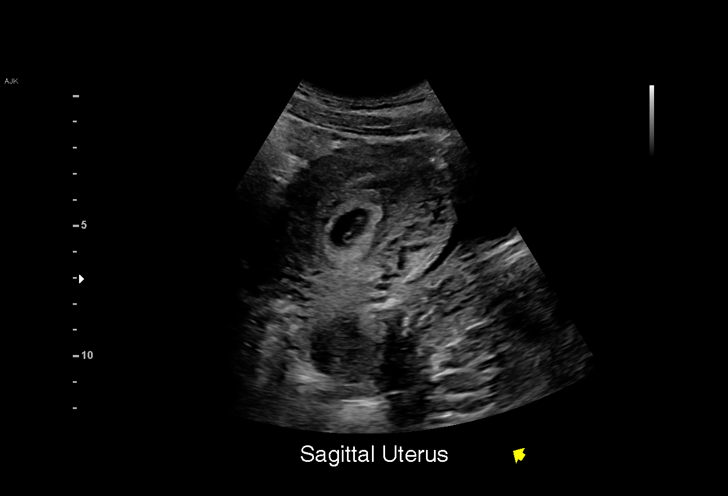
[im 8/35]
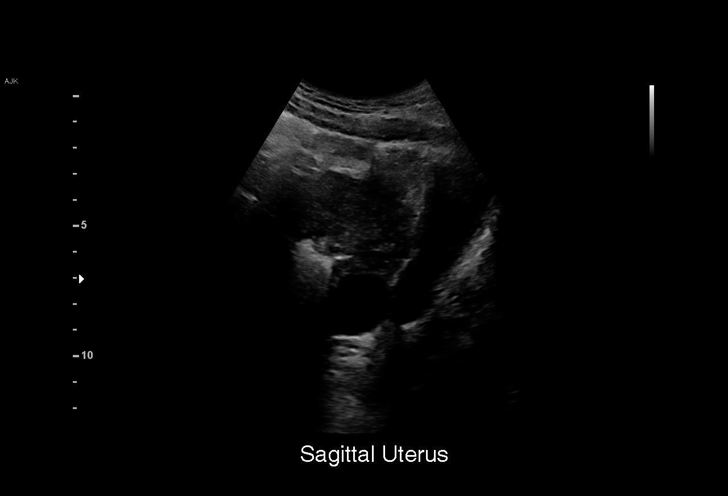
[im 11/35]
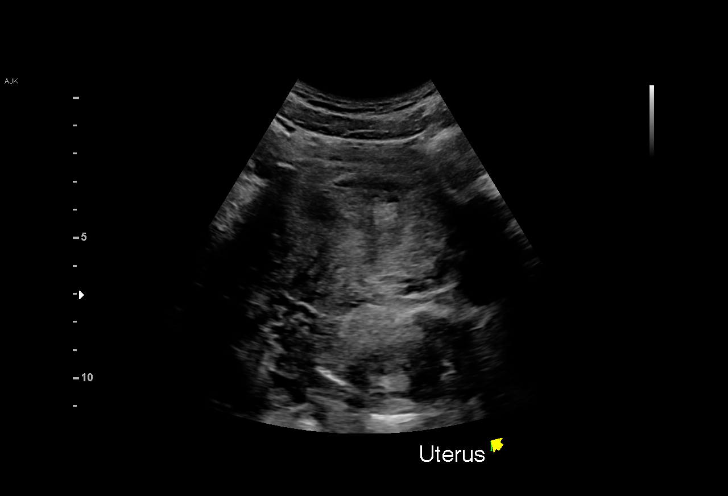
[im 13/35]
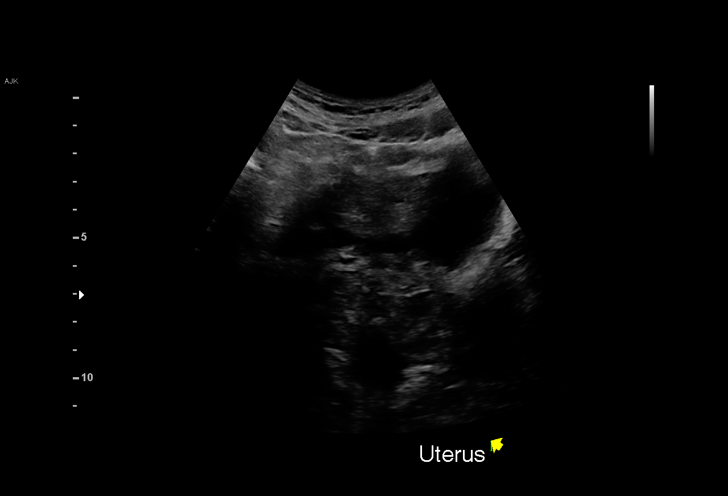
[im 16/35]
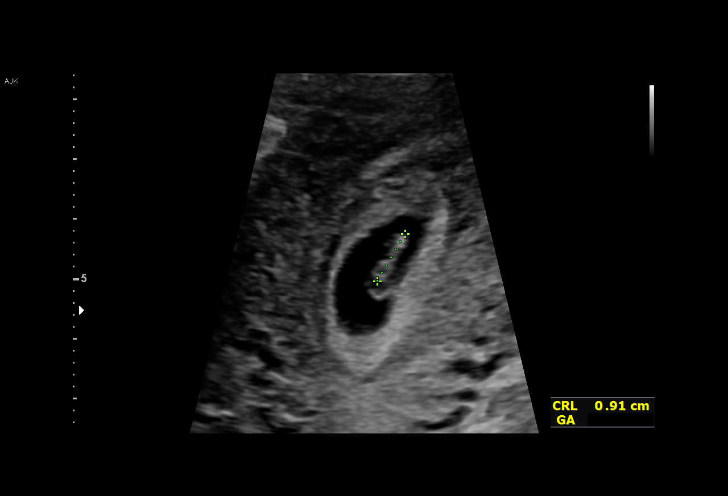
[im 18/35]
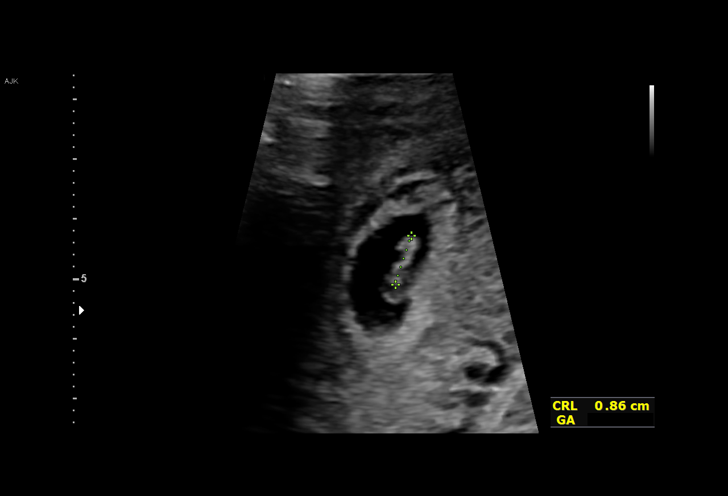
[im 19/35]
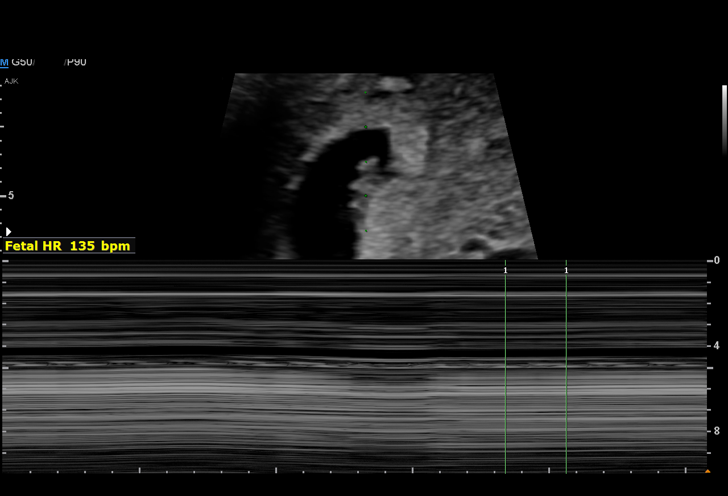
[im 22/35]
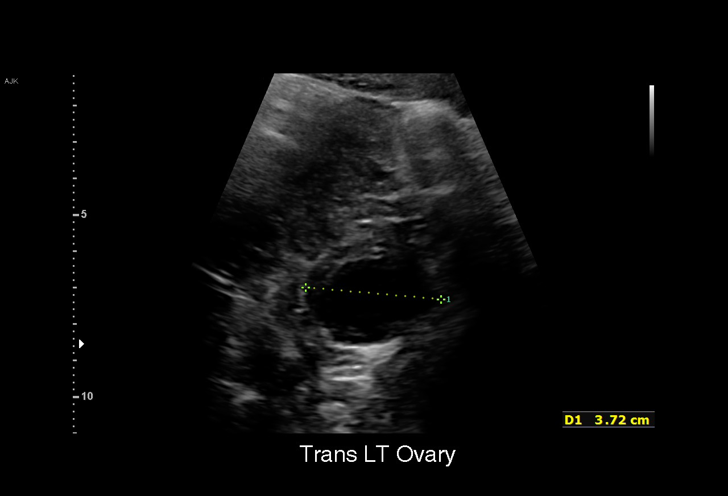
[im 24/35]
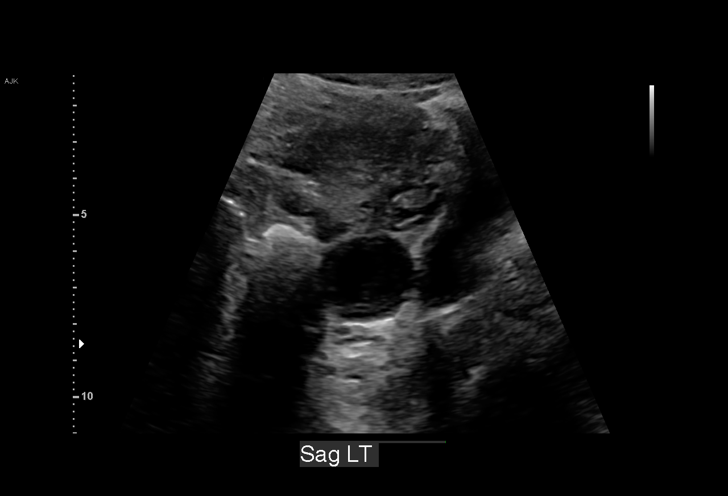
[im 27/35]
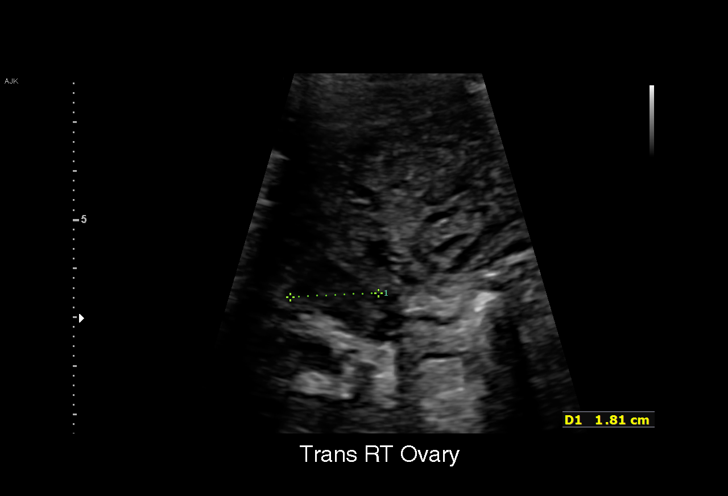
[im 29/35]
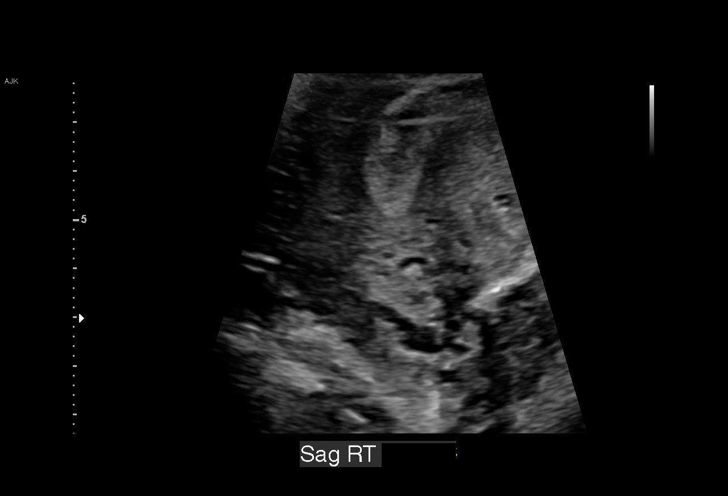
[im 32/35]
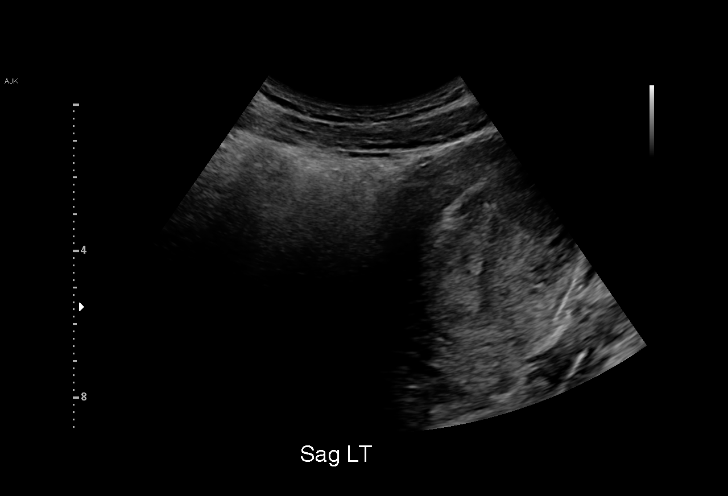
[im 35/35]
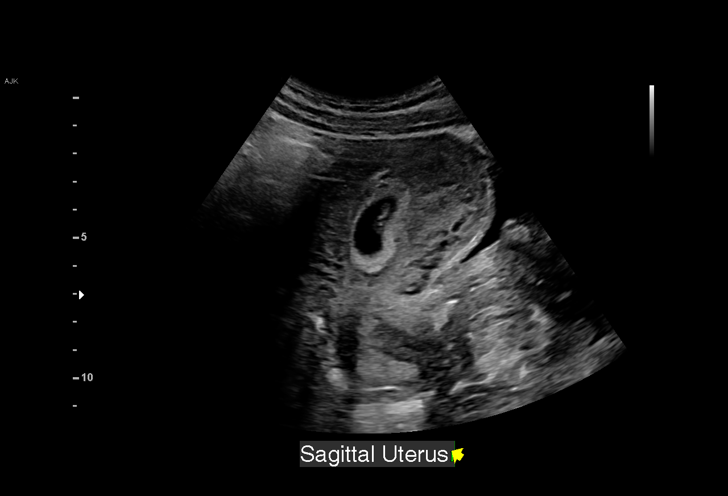

[15 of 28 positions shown; findings below may reference images not displayed]

FINDINGS: Intrauterine gestational sac: Single

Yolk sac:  Visualized.

Embryo:  Visualized.

Cardiac Activity: Visualized.

Heart Rate: 135 bpm

CRL:   9 mm   6 w 6 d                  US EDC: 03/10/2022

Subchorionic hemorrhage:  None visualized.

Maternal uterus/adnexae: The right ovary is visualized and is
normal. There is a 2.5 cm corpus luteal cyst in the left ovary,
ovarian blood flow is seen. No adnexal mass. No pelvic free fluid.
IMPRESSION: 1. Single live intrauterine pregnancy estimated gestational age 6
weeks 6 days by crown-rump length for ultrasound EDC 02/28/2022.
2. No subchorionic hemorrhage.

## 2023-09-17 ENCOUNTER — Ambulatory Visit: Payer: Medicaid Other

## 2024-04-06 ENCOUNTER — Encounter: Payer: Self-pay | Admitting: Family Medicine

## 2024-04-07 ENCOUNTER — Ambulatory Visit

## 2024-04-17 ENCOUNTER — Ambulatory Visit (INDEPENDENT_AMBULATORY_CARE_PROVIDER_SITE_OTHER): Admitting: *Deleted

## 2024-04-17 ENCOUNTER — Other Ambulatory Visit: Payer: Self-pay

## 2024-04-17 ENCOUNTER — Other Ambulatory Visit (HOSPITAL_COMMUNITY)
Admission: RE | Admit: 2024-04-17 | Discharge: 2024-04-17 | Disposition: A | Discharge status: Home / Self Care | Source: Ambulatory Visit | Attending: Family Medicine | Admitting: Family Medicine

## 2024-04-17 VITALS — BP 117/78 | HR 72 | Ht 66.0 in | Wt 145.6 lb

## 2024-04-17 DIAGNOSIS — R300 Dysuria: Secondary | ICD-10-CM | POA: Diagnosis present

## 2024-04-17 DIAGNOSIS — Z9189 Other specified personal risk factors, not elsewhere classified: Secondary | ICD-10-CM

## 2024-04-17 NOTE — Progress Notes (Cosign Needed Addendum)
 Pt presents with report of painful urination x4 days. Urine specimen obtained for UA and shows trace leukocyte - culture sent.   Pt also requests self swab for STI testing due to unprotected sex.  She denies vaginal sx. Pt advised she will be notified of test result via Mychart.  She also requested IUD check because she is no longer able to feel the strings. She was advised she will need to schedule provider appt for exam. She voiced understanding of all information and instructions given.

## 2024-04-18 LAB — POCT URINALYSIS DIP (DEVICE)
Bilirubin Urine: NEGATIVE
Glucose, UA: NEGATIVE mg/dL
Ketones, ur: NEGATIVE mg/dL
Nitrite: NEGATIVE
Protein, ur: NEGATIVE mg/dL
Specific Gravity, Urine: >=1.03 (ref 1.005–1.030)
Urobilinogen, UA: 0.2 mg/dL (ref 0.0–1.0)
pH: 5.5 (ref 5.0–8.0)

## 2024-04-18 LAB — CERVICOVAGINAL ANCILLARY ONLY
Chlamydia: NEGATIVE
Comment: NEGATIVE
Comment: NEGATIVE
Comment: NORMAL
Neisseria Gonorrhea: NEGATIVE
Trichomonas: NEGATIVE

## 2024-04-20 ENCOUNTER — Ambulatory Visit: Payer: Self-pay | Admitting: Family Medicine

## 2024-04-21 LAB — URINE CULTURE

## 2024-04-24 MED ORDER — CEFADROXIL 500 MG PO CAPS: 500.0000 mg | ORAL_CAPSULE | Freq: Two times a day (BID) | ORAL | 0 refills | Status: AC

## 2024-06-08 ENCOUNTER — Ambulatory Visit: Admitting: Physician Assistant

## 2024-06-23 ENCOUNTER — Encounter: Admitting: Family Medicine

## 2024-07-03 ENCOUNTER — Encounter: Admitting: Family Medicine
